# Patient Record
Sex: Male | Born: 1950 | Race: White | Hispanic: No | Marital: Married | Smoking: Never smoker
Health system: Southern US, Community
[De-identification: ages and names within clinical notes are randomized; demographics above are authoritative.]

## PROBLEM LIST (undated history)

## (undated) DIAGNOSIS — K219 Gastro-esophageal reflux disease without esophagitis: Secondary | ICD-10-CM

## (undated) DIAGNOSIS — G20A1 Parkinson's disease without dyskinesia, without mention of fluctuations: Secondary | ICD-10-CM

## (undated) DIAGNOSIS — I2510 Atherosclerotic heart disease of native coronary artery without angina pectoris: Secondary | ICD-10-CM

## (undated) DIAGNOSIS — I10 Essential (primary) hypertension: Secondary | ICD-10-CM

## (undated) DIAGNOSIS — A6920 Lyme disease, unspecified: Secondary | ICD-10-CM

## (undated) DIAGNOSIS — M1990 Unspecified osteoarthritis, unspecified site: Secondary | ICD-10-CM

## (undated) DIAGNOSIS — E785 Hyperlipidemia, unspecified: Secondary | ICD-10-CM

## (undated) DIAGNOSIS — I639 Cerebral infarction, unspecified: Secondary | ICD-10-CM

## (undated) DIAGNOSIS — H409 Unspecified glaucoma: Secondary | ICD-10-CM

## (undated) DIAGNOSIS — G20 Parkinson's disease: Secondary | ICD-10-CM

## (undated) DIAGNOSIS — C801 Malignant (primary) neoplasm, unspecified: Secondary | ICD-10-CM

## (undated) HISTORY — PX: ORIF ORBITAL FRACTURE: SHX5312

## (undated) HISTORY — PX: OTHER SURGICAL HISTORY: SHX169

## (undated) HISTORY — PX: BICEPS TENDON REPAIR: SHX566

## (undated) HISTORY — DX: Gastro-esophageal reflux disease without esophagitis: K21.9

## (undated) HISTORY — DX: Cerebral infarction, unspecified: I63.9

## (undated) HISTORY — DX: Atherosclerotic heart disease of native coronary artery without angina pectoris: I25.10

## (undated) HISTORY — PX: CORONARY ARTERY BYPASS GRAFT: SHX141

## (undated) HISTORY — PX: SPINAL FIXATION SURGERY W/ IMPLANT: SHX785

## (undated) HISTORY — DX: Unspecified glaucoma: H40.9

## (undated) HISTORY — DX: Hyperlipidemia, unspecified: E78.5

## (undated) HISTORY — PX: ANEURYSM COILING: SHX5349

## (undated) HISTORY — DX: Lyme disease, unspecified: A69.20

---

## 2010-04-28 ENCOUNTER — Ambulatory Visit (HOSPITAL_COMMUNITY)
Admission: EM | Admit: 2010-04-28 | Discharge: 2010-04-28 | Disposition: A | Discharge status: Home / Self Care | Payer: Managed Care, Other (non HMO) | Attending: Gastroenterology | Admitting: Gastroenterology

## 2010-04-28 ENCOUNTER — Other Ambulatory Visit: Payer: Self-pay | Admitting: Gastroenterology

## 2010-04-28 DIAGNOSIS — I10 Essential (primary) hypertension: Secondary | ICD-10-CM | POA: Insufficient documentation

## 2010-04-28 DIAGNOSIS — Y838 Other surgical procedures as the cause of abnormal reaction of the patient, or of later complication, without mention of misadventure at the time of the procedure: Secondary | ICD-10-CM

## 2010-04-28 DIAGNOSIS — D129 Benign neoplasm of anus and anal canal: Secondary | ICD-10-CM

## 2010-04-28 DIAGNOSIS — K648 Other hemorrhoids: Secondary | ICD-10-CM

## 2010-04-28 DIAGNOSIS — Z9889 Other specified postprocedural states: Secondary | ICD-10-CM | POA: Insufficient documentation

## 2010-04-28 DIAGNOSIS — D128 Benign neoplasm of rectum: Secondary | ICD-10-CM | POA: Insufficient documentation

## 2010-04-28 DIAGNOSIS — IMO0002 Reserved for concepts with insufficient information to code with codable children: Secondary | ICD-10-CM

## 2010-04-28 DIAGNOSIS — K625 Hemorrhage of anus and rectum: Secondary | ICD-10-CM | POA: Insufficient documentation

## 2010-04-28 LAB — DIFFERENTIAL
Basophils Absolute: 0 10*3/uL (ref 0.0–0.1)
Basophils Relative: 0 % (ref 0–1)
Eosinophils Absolute: 0.1 10*3/uL (ref 0.0–0.7)
Eosinophils Relative: 2 % (ref 0–5)
Lymphocytes Relative: 31 % (ref 12–46)
Lymphs Abs: 2.2 10*3/uL (ref 0.7–4.0)
Monocytes Absolute: 0.4 10*3/uL (ref 0.1–1.0)
Monocytes Relative: 5 % (ref 3–12)
Neutro Abs: 4.4 10*3/uL (ref 1.7–7.7)
Neutrophils Relative %: 62 % (ref 43–77)

## 2010-04-28 LAB — CBC
HCT: 42.2 % (ref 39.0–52.0)
Hemoglobin: 14.2 g/dL (ref 13.0–17.0)
MCH: 30 pg (ref 26.0–34.0)
MCHC: 33.6 g/dL (ref 30.0–36.0)
MCV: 89.2 fL (ref 78.0–100.0)
Platelets: 268 10*3/uL (ref 150–400)
RBC: 4.73 MIL/uL (ref 4.22–5.81)
RDW: 13.7 % (ref 11.5–15.5)
WBC: 7.2 10*3/uL (ref 4.0–10.5)

## 2010-04-28 LAB — SAMPLE TO BLOOD BANK

## 2010-04-28 LAB — BASIC METABOLIC PANEL
BUN: 14 mg/dL (ref 6–23)
CO2: 26 mEq/L (ref 19–32)
Calcium: 9.1 mg/dL (ref 8.4–10.5)
Chloride: 106 mEq/L (ref 96–112)
Creatinine, Ser: 0.86 mg/dL (ref 0.4–1.5)
GFR calc Af Amer: >60 mL/min (ref >60)
GFR calc non Af Amer: >60 mL/min (ref >60)
Glucose, Bld: 112 mg/dL — ABNORMAL HIGH (ref 70–99)
Potassium: 4 mEq/L (ref 3.5–5.1)
Sodium: 138 mEq/L (ref 135–145)

## 2010-04-28 LAB — APTT: aPTT: 31 seconds (ref 24–37)

## 2010-04-28 LAB — PROTIME-INR
INR: 0.99 (ref 0.00–1.49)
Prothrombin Time: 13.3 seconds (ref 11.6–15.2)

## 2010-06-02 NOTE — Op Note (Signed)
NAME:  Brett Patterson, Brett Patterson NO.:  [id number]  MEDICAL RECORD NO.:  [id number]           PATIENT TYPE:  O  LOCATION:  DAYP                          FACILITY:  [facility]  PHYSICIAN:  Jonette Eva, M.D.     DATE OF BIRTH:  11/30/1950  DATE OF PROCEDURE:  04/28/2010 DATE OF DISCHARGE:                              OPERATIVE REPORT   PRIMARY PHYSICIAN:  Dr. Rometta Emery, [place], [place].  REFERRING PHYSICIAN:  Devoria Albe, MD  PRIMARY GASTROENTEROLOGIST:  Dr. Halina Maidens.  PROCEDURE:  Sigmoidoscopy with cold forceps polypectomy and [employer] resolution clip placement followed by epinephrine injection (4 mL).  INDICATION FOR EXAM:  [name] is a 60 year old male who presented for average-risk colon cancer screening on April 27, 2010.  He had a 3- cm polyp removed from his rectum.  Snare cautery was applied.  I spoke with Dr. Aleene Davidson and he had no hemoclips available due to a shipping error.  The patient started having rectal bleeding last night.  He drove to our emergency department for evaluation.  In the emergency department, he remained hemodynamically stable, but had bright red blood per rectum.  FINDINGS: 1. Large clot in the rectum with old clotted blood extending to the     distal sigmoid colon.  The large clot was aspirated and active     bleeding was noted at the base of the polypectomy site.  One Boston     resolution clip was placed.  Hemostasis was achieved.  4 mL of     epinephrine was injected around the base of the polypectomy site.     The second Boston resolution clip was placed distally to the first     clip.  Hemostasis was achieved. 2. Unable to appreciate diverticula in the descending or sigmoid     colon.  Otherwise, no masses, inflammatory changes, or AVMs seen. 3. 2 to 3-mm sessile rectal polyps, which were hyperplastic appearing     were removed. 4. Moderate internal hemorrhoids.  Otherwise normal retroflexed view     of  the rectum.  RECOMMENDATIONS: 1. The patient should avoid aspirin, NSAIDs, and anticoagulation for     10 days. 2. He should follow a low-residue diet for 10 days.  Then he may     follow a high-fiber diet.  He is given handout on high-fiber diet     and on low-residue diet. 3. Screening colonoscopy based on pathology results. 4. We will call him with results of his pathology.  He is also given     information on hemorrhoids.  MEDICATIONS: 1. Demerol 50 mg IV. 2. Versed 2 mg IV. 3. Phenergan 12.5 mg IV.  PROCEDURE TECHNIQUE:  Physical exam was performed.  Informed consent was obtained from the patient after explaining the benefits, risks, and alternatives to the procedure.  The patient was connected to monitor and placed in left lateral position.  Continuous oxygen was provided by nasal cannula and IV medicine administered through an indwelling cannula.  After administration of sedation, rectal exam, the patient's rectum was intubated and scope was advanced under direct visualization to the descending colon.  No old blood or fresh blood was seen in the descending colon.  Semi-formed stool was seen in the descending colon and proximal sigmoid colon.  The scope was removed slowly by carefully examining the color, texture, anatomy, and integrity of the mucosa on the way out.  The patient was recovered in endoscopy and discharged home in satisfactory condition.  PATH: HYPERPLASTIC POLYP   Jonette Eva, M.D.     SF/MEDQ  D:  04/28/2010  T:  04/28/2010  Job:  [id number]  cc:   Rometta Emery, MD [facility]  Halina Maidens, MD [facility]  Electronically Signed by Jonette Eva M.D. on 06/02/2010 02:19:44 PM

## 2010-06-02 NOTE — Consult Note (Signed)
NAME:  Brett Patterson, Brett Patterson NO.:  [id number]  MEDICAL RECORD NO.:  [id number]           PATIENT TYPE:  O  LOCATION:  DAYP                          FACILITY:  [facility]  PHYSICIAN:  Jonette Eva, M.D.     DATE OF BIRTH:  1950/05/22  DATE OF CONSULTATION:  04/28/2010 DATE OF DISCHARGE:                                CONSULTATION   REFERRING PHYSICIAN:  Dr. Lynelle Doctor.  PRIMARY GASTROENTEROLOGIST:  Halina Maidens, MD  REASON FOR CONSULTATION:  Rectal bleeding.  HISTORY OF PRESENT ILLNESS:  Brett Patterson is a 60 year old male who presented on April 27, 2010 for average risk colon cancer screening. Dr. Aleene Davidson removed to 3-cm polyp and cauterize the base.  Reportedly, he denied any clips available.  The printed report shows reads that he had a 3-cm sessile multilobulated polyp in his rectum which was removed piecemeal.  The sigmoid descending transverse ascending colon and cecum were without disease.  It was a good prep.  The patient started having rectal bleeding last night.  Reportedly, the patient was instructed to come to our emergency department because Dr. Aleene Davidson does not have privileges at the [facility].  He drove from [place] to [facility].  His last meal was yesterday.  He does not use aspirin, BC, Goody Powders, ibuprofen, Motrin or Aleve.  He does use Vicodin and Xanax as needed for back pain and sleep.  He has never had any difficulties with sedation.  He recently was on a Z-PAK.  He has some lower abdominal pressure, but no abdominal pain.  He denies any chest pain, shortness of breath, dizziness or syncope.  PAST MEDICAL HISTORY: 1. Back pain. 2. Lyme disease.  PAST SURGICAL HISTORY:  Multiple back surgeries.  ALLERGIES:  CODEINE AND LATEX.  FAMILY HISTORY:  He denies any family history, colon cancer, or colon polyps.  SOCIAL HISTORY:  He does not drink, does not smoke.  He is a retired from [employer].  He has been married 25  years.  REVIEW OF SYSTEMS:  Per the HPI, otherwise all systems are negative.  PHYSICAL EXAM:  VITAL SIGNS:  Temperature 98.4, systolic blood pressure 136-145, heart rate 81-64, respiratory rate 18 -19. GENERAL:  He is in no apparent distress, alert and oriented x4.  He is ambulating without assistance. HEENT:  Atraumatic, normocephalic.  Pupils equal and reactive to light. Mouth, no oral lesions.  Posterior pharynx without erythema or exudate. NECK:  Full range of motion.  No lymphadenopathy. LUNGS:  Clear to auscultation bilaterally. CARDIOVASCULAR:  Regular rate and rhythm.  No murmur, normal S1 and S2. ABDOMEN:  Bowel sounds are present, soft, mildly distended, nontender. No rebound or guarding. NEURO:  No focal neurologic deficits.  LABS:  Creatinine 0.86, INR 0.99, hemoglobin 14.2, platelets 268.  ASSESSMENT:  Brett Patterson is a 60 year old male with a post polypectomy bleed.  PLAN: 1. Flexible sigmoidoscopy today to attempt to control the bleeding. 2. I explained to the patient and to his wife that the bleeding cannot     be controlled.  He will need to go to the operating room and have     the  surgeons stop the bleeding. 3. I did attempt to discuss the case with Dr. Aleene Davidson prior to     evaluating the patient, but he was unavailable due to his being in     the middle of the procedure.     Jonette Eva, M.D.     SF/MEDQ  D:  04/28/2010  T:  04/28/2010  Job:  [id number]  cc:   Halina Maidens, MD  Electronically Signed by Jonette Eva M.D. on 06/02/2010 02:18:13 PM

## 2015-09-19 ENCOUNTER — Telehealth: Payer: Self-pay | Admitting: Gastroenterology

## 2015-09-19 NOTE — Telephone Encounter (Signed)
Pt and his wife are transferring from Dr Anastasio Auerbach office to [place] for their colonoscopy. I have colonoscopy and path on patient and waiting for records on his wife. They are on vacation the week of Sept 18th and would like to be scheduled during this time. Due to insurance patient may not need office visit, but a triage. Please call [phone] or [phone] (I laid the records on DS office chair)

## 2015-09-22 NOTE — Telephone Encounter (Signed)
Wife called again this morning asking about her records being transferred to [place]. I told her that I have both hers and her husband's records and the triage nurse will be in touch with both of them.  She is wanting to have procedure done Oct 9th because she will be on vacation and would like to know something today. I told her that DS said she would try to call her after lunch today.

## 2015-09-24 ENCOUNTER — Other Ambulatory Visit: Payer: Self-pay

## 2015-09-24 DIAGNOSIS — Z1211 Encounter for screening for malignant neoplasm of colon: Secondary | ICD-10-CM

## 2015-09-24 NOTE — Telephone Encounter (Signed)
SUPREP SPLIT DOSING- FULL LIQUIDS WITH BREAKFAST.  Full Liquid Diet A high-calorie, high-protein supplement should be used to meet your nutritional requirements when the full liquid diet is continued for more than 2 or 3 days. If this diet is to be used for an extended period of time (more than 7 days), a multivitamin should be considered.  Breads and Starches  Allowed: None are allowed   Avoid: Any others.    Potatoes/Pasta/Rice  Allowed: ANY ITEM AS A SOUP OR SMALL PLATE OF MASHED POTATOES OR SCRAMBLED EGGS.       Vegetables  Allowed: Strained tomato or vegetable juice. Vegetables pureed in soup.   Avoid: Any others.    Fruit  Allowed: Any strained fruit juices and fruit drinks. Include 1 serving of citrus or vitamin C-enriched fruit juice daily.   Avoid: Any others.  Meat and Meat Substitutes  Allowed: Egg  Avoid: Any meat, fish, or fowl. All cheese.  Milk  Allowed: SOY Milk beverages, including milk shakes and instant breakfast mixes. Smooth yogurt.   Avoid: Any others. Avoid dairy products if not tolerated.    Soups and Combination Foods  Allowed: Broth, strained cream soups. Strained, broth-based soups.   Avoid: Any others.    Desserts and Sweets  Allowed: flavored gelatin, tapioca, ice cream, sherbet, smooth pudding, junket, fruit ices, frozen ice pops, pudding pops, frozen fudge pops, chocolate syrup. Sugar, honey, jelly, syrup.   Avoid: Any others.  Fats and Oils  Allowed: Margarine, butter, cream, sour cream, oils.   Avoid: Any others.  Beverages  Allowed: All.   Avoid: None.  Condiments  Allowed: Iodized salt, pepper, spices, flavorings. Cocoa powder.   Avoid: Any others.    SAMPLE MEAL PLAN Breakfast   cup orange juice.   1 OR 2 EGGS  1 cup milk.   1 cup beverage (coffee or tea).   Cream or sugar, if desired.    Midmorning Snack  2 SCRAMBLED OR HARD BOILED EGG   Lunch  1 cup cream soup.    cup fruit juice.    1 cup milk.    cup custard.   1 cup beverage (coffee or tea).   Cream or sugar, if desired.    Midafternoon Snack  1 cup milk shake.  Dinner  1 cup cream soup.    cup fruit juice.   1 cup MILK    cup pudding.   1 cup beverage (coffee or tea).   Cream or sugar, if desired.  Evening Snack  1 cup supplement.  To increase calories, add sugar, cream, butter, or margarine if possible. Nutritional supplements will also increase the total calories.

## 2015-09-24 NOTE — Telephone Encounter (Signed)
Gastroenterology Pre-Procedure Review  Request Date: 09/19/2015 Requesting Physician:   PATIENT REVIEW QUESTIONS: The patient responded to the following health history questions as indicated:    1. Diabetes Melitis: no 2. Joint replacements in the past 12 months: no 3. Major health problems in the past 3 months: no 4. Has an artificial valve or MVP: no 5. Has a defibrillator: no 6. Has been advised in past to take antibiotics in advance of a procedure like teeth cleaning: no 7. Family history of colon cancer: no  8. Alcohol Use: no 9. History of sleep apnea: no     MEDICATIONS & ALLERGIES:    Patient reports the following regarding taking any blood thinners:   Plavix? no Aspirin? no Coumadin? no  Patient confirms/reports the following medications:  Current Outpatient Prescriptions  Medication Sig Dispense Refill  . b complex vitamins tablet Take 1 tablet by mouth daily.    Marland Kitchen HYDROcodone-acetaminophen (NORCO/VICODIN) 5-325 MG tablet Take 1 tablet by mouth.    . Multiple Vitamin (MULTIVITAMIN) tablet Take 1 tablet by mouth daily.    . NON FORMULARY Vitamin D    One tablet daily    . rOPINIRole (REQUIP) 2 MG tablet Take 2 mg by mouth at bedtime. Takes one tablet 2-4 times a day     No current facility-administered medications for this visit.     Patient confirms/reports the following allergies:  No Known Allergies  No orders of the defined types were placed in this encounter.   AUTHORIZATION INFORMATION Primary Insurance:   ID #:  Group #:  Pre-Cert / Auth required: Pre-Cert / Auth #:   Secondary Insurance:   ID #:  Group #:  Pre-Cert / Auth required Pre-Cert / Auth #:   SCHEDULE INFORMATION: Procedure has been scheduled as follows:  Date: 10/13/2015             Time:  10:30 AM Location: [facility]  This Gastroenterology Pre-Precedure Review Form is being routed to the following provider(s): Barney Drain, MD

## 2015-10-01 MED ORDER — NA SULFATE-K SULFATE-MG SULF 17.5-3.13-1.6 GM/177ML PO SOLN: 1.0000 | ORAL | 0 refills | Status: DC

## 2015-10-01 NOTE — Addendum Note (Signed)
Addended by: Everardo All on: 10/01/2015 09:53 AM   Modules accepted: Orders

## 2015-10-01 NOTE — Telephone Encounter (Signed)
Rx sent to the pharmacy and instructions mailed to pt.  

## 2015-10-08 ENCOUNTER — Telehealth: Payer: Self-pay

## 2015-10-08 NOTE — Telephone Encounter (Signed)
I called [employer] @ [phone] and spoke to Aristes R who said PA is required for the screening colonoscopy.  Pending PA # [id number].

## 2015-10-13 ENCOUNTER — Encounter (HOSPITAL_COMMUNITY): Payer: Self-pay

## 2015-10-13 ENCOUNTER — Encounter (HOSPITAL_COMMUNITY)
Admission: RE | Disposition: A | Discharge status: Home / Self Care | Payer: Self-pay | Source: Ambulatory Visit | Attending: Gastroenterology

## 2015-10-13 ENCOUNTER — Ambulatory Visit (HOSPITAL_COMMUNITY)
Admission: RE | Admit: 2015-10-13 | Discharge: 2015-10-13 | Disposition: A | Discharge status: Home / Self Care | Payer: 59 | Source: Ambulatory Visit | Attending: Gastroenterology | Admitting: Gastroenterology

## 2015-10-13 DIAGNOSIS — Z1211 Encounter for screening for malignant neoplasm of colon: Secondary | ICD-10-CM | POA: Diagnosis not present

## 2015-10-13 DIAGNOSIS — D123 Benign neoplasm of transverse colon: Secondary | ICD-10-CM | POA: Diagnosis not present

## 2015-10-13 DIAGNOSIS — K648 Other hemorrhoids: Secondary | ICD-10-CM | POA: Diagnosis not present

## 2015-10-13 DIAGNOSIS — I10 Essential (primary) hypertension: Secondary | ICD-10-CM | POA: Insufficient documentation

## 2015-10-13 DIAGNOSIS — Z86010 Personal history of colonic polyps: Secondary | ICD-10-CM

## 2015-10-13 DIAGNOSIS — K621 Rectal polyp: Secondary | ICD-10-CM | POA: Diagnosis not present

## 2015-10-13 DIAGNOSIS — E785 Hyperlipidemia, unspecified: Secondary | ICD-10-CM | POA: Diagnosis not present

## 2015-10-13 DIAGNOSIS — D128 Benign neoplasm of rectum: Secondary | ICD-10-CM

## 2015-10-13 HISTORY — PX: COLONOSCOPY: SHX5424

## 2015-10-13 HISTORY — DX: Essential (primary) hypertension: I10

## 2015-10-13 SURGERY — COLONOSCOPY
Anesthesia: Moderate Sedation

## 2015-10-13 MED ORDER — SODIUM CHLORIDE 0.9 % IV SOLN: INTRAVENOUS | Status: DC

## 2015-10-13 MED ORDER — MIDAZOLAM HCL 5 MG/5ML IJ SOLN
INTRAMUSCULAR | Status: AC
  Filled 2015-10-13: qty 10

## 2015-10-13 MED ORDER — STERILE WATER FOR IRRIGATION IR SOLN
Status: DC | PRN
  Administered 2015-10-13: 2.5 mL

## 2015-10-13 MED ORDER — MIDAZOLAM HCL 5 MG/5ML IJ SOLN
INTRAMUSCULAR | Status: DC | PRN
Start: 2015-10-13 — End: 2015-10-13
  Administered 2015-10-13: 1 mg via INTRAVENOUS
  Administered 2015-10-13 (×2): 2 mg via INTRAVENOUS

## 2015-10-13 MED ORDER — MEPERIDINE HCL 100 MG/ML IJ SOLN
INTRAMUSCULAR | Status: DC | PRN
  Administered 2015-10-13: 25 mg via INTRAVENOUS
  Administered 2015-10-13: 50 mg via INTRAVENOUS

## 2015-10-13 MED ORDER — MEPERIDINE HCL 100 MG/ML IJ SOLN
INTRAMUSCULAR | Status: AC
  Filled 2015-10-13: qty 2

## 2015-10-13 NOTE — Op Note (Addendum)
[facility] Patient Name: Brett Patterson Procedure Date: 10/13/2015 10:05 AM MRN: [id number] Date of Birth: 1950/03/30 Attending MD: Barney Drain , MD CSN: [id number] Age: 65 Admit Type: Outpatient Procedure:                Colonoscopy WITH COLD SNARE/SNARE CAUTERY                            POLYPECTOMY Indications:              High risk colon cancer surveillance: Personal                            history of colonic polyps Providers:                Barney Drain, MD, Lurline Del, RN, Purcell Nails. [place],                            [employer] officer Referring MD:              Medicines:                Meperidine 75 mg IV, Midazolam 5 mg IV Complications:            No immediate complications. Estimated Blood Loss:     Estimated blood loss was minimal. Procedure:                Pre-Anesthesia Assessment:                           - Prior to the procedure, a History and Physical                            was performed, and patient medications and                            allergies were reviewed. The patient's tolerance of                            previous anesthesia was also reviewed. The risks                            and benefits of the procedure and the sedation                            options and risks were discussed with the patient.                            All questions were answered, and informed consent                            was obtained. Prior Anticoagulants: The patient has                            taken no previous anticoagulant or antiplatelet  agents. ASA Grade Assessment: I - A normal, healthy                            patient. After reviewing the risks and benefits,                            the patient was deemed in satisfactory condition to                            undergo the procedure. After obtaining informed                            consent, the colonoscope was passed under direct                            vision.  Throughout the procedure, the patient's                            blood pressure, pulse, and oxygen saturations were                            monitored continuously. The [id number])                            scope was introduced through the anus and advanced                            to the the cecum, identified by appendiceal orifice                            and ileocecal valve. The patient tolerated the                            procedure well. The quality of the bowel                            preparation was excellent. The colonoscopy was                            performed without difficulty. The ileocecal valve,                            appendiceal orifice, and rectum were photographed. Scope In: 10:19:20 AM Scope Out: 10:34:46 AM Scope Withdrawal Time: 0 hours 13 minutes 9 seconds  Total Procedure Duration: 0 hours 15 minutes 26 seconds  Findings:      Two sessile polyps were found in the proximal transverse colon and       distal transverse colon. The polyps were 4 to 6 mm in size. These polyps       were removed with a hot snare. Resection and retrieval were complete.      Two sessile polyps were found in the rectum. The polyps were 4 to 12 mm       in size. These polyps were removed with a hot snare  AND COLD SNARE.       Resection and retrieval were complete. one bs clip laced inrectum to       prevent bleeding.      Non-bleeding internal hemorrhoids were found. The hemorrhoids were       moderate. Impression:               - Two 4 to 6 mm polyps in the proximal transverse                            colon and in the distal transverse colon, removed                            with a hot snare. Resected and retrieved.                           - Two 4(CS) to 12 mm(HS) polyps in the rectum,                            removed with a hot snare. Resected and retrieved.                           - Non-bleeding internal hemorrhoids. Moderate Sedation:       Moderate (conscious) sedation was administered by the endoscopy nurse       and supervised by the endoscopist. The following parameters were       monitored: oxygen saturation, heart rate, blood pressure, and response       to care. Total physician intraservice time was 26 minutes. Recommendation:           - High fiber diet.                           - Continue present medications.                           - Await pathology results. NO MRI FOR 30 DAYS.                           - Repeat colonoscopy 1-3 YEARS for surveillance.                            ALL FIRST DEGREE RELATIVES NEEDS A TCS AT AGE 55.                           - Patient has a contact number available for                            emergencies. The signs and symptoms of potential                            delayed complications were discussed with the                            patient. Return to normal activities tomorrow.  Written discharge instructions were provided to the                            patient. Procedure Code(s):        --- Professional ---                           [phone], Colonoscopy, flexible; with removal of                            tumor(s), polyp(s), or other lesion(s) by snare                            technique                           99152, Moderate sedation services provided by the                            same physician or other qualified health care                            professional performing the diagnostic or                            therapeutic service that the sedation supports,                            requiring the presence of an independent trained                            observer to assist in the monitoring of the                            patient's level of consciousness and physiological                            status; initial 15 minutes of intraservice time,                            patient age 91 years or older                            [phone], Moderate sedation services; each additional                            15 minutes intraservice time Diagnosis Code(s):        --- Professional ---                           Z86.010, Personal history of colonic polyps                           D12.3, Benign neoplasm of transverse colon (hepatic  flexure or splenic flexure)                           K62.1, Rectal polyp                           K64.8, Other hemorrhoids CPT copyright 2016 American Medical Association. All rights reserved. The codes documented in this report are preliminary and upon coder review may  be revised to meet current compliance requirements. Barney Drain, MD Barney Drain, MD 10/13/2015 10:49:47 AM This report has been signed electronically. Number of Addenda: 0

## 2015-10-13 NOTE — Discharge Instructions (Signed)
You had 4 polyps removed. One was GREATER THAN 1 CM.  IT APPEARED TO BE IN THE SAME PLACE AS THE PREVIOUS POLYP.  I PLACED A CLIP TO PREVENT BLEEDING IN 7-10 DAYS. You have Small internal hemorrhoids.    NO MRI FOR 30 DAYS DUE TO METAL CLIP PLACEMENT IN THE RECTUM.  CONTINUE YOUR WEIGHT LOSS EFFORTS. LOSE TEN POUNDS.  DRINK WATER TO KEEP YOUR URINE LIGHT YELLOW.  FOLLOW A HIGH FIBER DIET. AVOID ITEMS THAT CAUSE BLOATING & GAS. SEE INFO BELOW.  YOUR BIOPSY RESULTS WILL BE AVAILABLE IN MY CHART AFTER  OCT 11 AND MY OFFICE WILL CONTACT YOU IN 10-14 DAYS WITH YOUR RESULTS.   Next colonoscopy in 1-3 years. YOUR SISTERS, BROTHERS, CHILDREN, AND PARENTS NEED TO HAVE A COLONOSCOPY STARTING AT THE AGE OF 40.    Colonoscopy Care After Read the instructions outlined below and refer to this sheet in the next week. These discharge instructions provide you with general information on caring for yourself after you leave the hospital. While your treatment has been planned according to the most current medical practices available, unavoidable complications occasionally occur. If you have any problems or questions after discharge, call DR. Linde Wilensky, [phone].  ACTIVITY  You may resume your regular activity, but move at a slower pace for the next 24 hours.   Take frequent rest periods for the next 24 hours.   Walking will help get rid of the air and reduce the bloated feeling in your belly (abdomen).   No driving for 24 hours (because of the medicine (anesthesia) used during the test).   You may shower.   Do not sign any important legal documents or operate any machinery for 24 hours (because of the anesthesia used during the test).    NUTRITION  Drink plenty of fluids.   You may resume your normal diet as instructed by your doctor.   Begin with a light meal and progress to your normal diet. Heavy or fried foods are harder to digest and may make you feel sick to your stomach (nauseated).    Avoid alcoholic beverages for 24 hours or as instructed.    MEDICATIONS  You may resume your normal medications.   WHAT YOU CAN EXPECT TODAY  Some feelings of bloating in the abdomen.   Passage of more gas than usual.   Spotting of blood in your stool or on the toilet paper  .  IF YOU HAD POLYPS REMOVED DURING THE COLONOSCOPY:  Eat a soft diet IF YOU HAVE NAUSEA, BLOATING, ABDOMINAL PAIN, OR VOMITING.    FINDING OUT THE RESULTS OF YOUR TEST Not all test results are available during your visit. DR. Oneida Alar WILL CALL YOU WITHIN 14 DAYS OF YOUR PROCEDUE WITH YOUR RESULTS. Do not assume everything is normal if you have not heard from DR. Idell Hissong, CALL HER OFFICE AT [phone].  SEEK IMMEDIATE MEDICAL ATTENTION AND CALL THE OFFICE: [phone] IF:  You have more than a spotting of blood in your stool.   Your belly is swollen (abdominal distention).   You are nauseated or vomiting.   You have a temperature over 101F.   You have abdominal pain or discomfort that is severe or gets worse throughout the day.   High-Fiber Diet A high-fiber diet changes your normal diet to include more whole grains, legumes, fruits, and vegetables. Changes in the diet involve replacing refined carbohydrates with unrefined foods. The calorie level of the diet is essentially unchanged. The Dietary Reference Intake (recommended  amount) for adult males is 38 grams per day. For adult females, it is 25 grams per day. Pregnant and lactating women should consume 28 grams of fiber per day. Fiber is the intact part of a plant that is not broken down during digestion. Functional fiber is fiber that has been isolated from the plant to provide a beneficial effect in the body. PURPOSE  Increase stool bulk.   Ease and regulate bowel movements.   Lower cholesterol.   REDUCE RISK OF COLON CANCER  INDICATIONS THAT YOU NEED MORE FIBER  Constipation and hemorrhoids.   Uncomplicated diverticulosis  (intestine condition) and irritable bowel syndrome.   Weight management.   As a protective measure against hardening of the arteries (atherosclerosis), diabetes, and cancer.   GUIDELINES FOR INCREASING FIBER IN THE DIET  Start adding fiber to the diet slowly. A gradual increase of about 5 more grams (2 slices of whole-wheat bread, 2 servings of most fruits or vegetables, or 1 bowl of high-fiber cereal) per day is best. Too rapid an increase in fiber may result in constipation, flatulence, and bloating.   Drink enough water and fluids to keep your urine clear or pale yellow. Water, juice, or caffeine-free drinks are recommended. Not drinking enough fluid may cause constipation.   Eat a variety of high-fiber foods rather than one type of fiber.   Try to increase your intake of fiber through using high-fiber foods rather than fiber pills or supplements that contain small amounts of fiber.   The goal is to change the types of food eaten. Do not supplement your present diet with high-fiber foods, but replace foods in your present diet.   INCLUDE A VARIETY OF FIBER SOURCES  Replace refined and processed grains with whole grains, canned fruits with fresh fruits, and incorporate other fiber sources. White rice, white breads, and most bakery goods contain little or no fiber.   Brown whole-grain rice, buckwheat oats, and many fruits and vegetables are all good sources of fiber. These include: broccoli, Brussels sprouts, cabbage, cauliflower, beets, sweet potatoes, white potatoes (skin on), carrots, tomatoes, eggplant, squash, berries, fresh fruits, and dried fruits.   Cereals appear to be the richest source of fiber. Cereal fiber is found in whole grains and bran. Bran is the fiber-rich outer coat of cereal grain, which is largely removed in refining. In whole-grain cereals, the bran remains. In breakfast cereals, the largest amount of fiber is found in those with "bran" in their names. The fiber  content is sometimes indicated on the label.   You may need to include additional fruits and vegetables each day.   In baking, for 1 cup white flour, you may use the following substitutions:   1 cup whole-wheat flour minus 2 tablespoons.   1/2 cup white flour plus 1/2 cup whole-wheat flour.   Polyps, Colon  A polyp is extra tissue that grows inside your body. Colon polyps grow in the large intestine. The large intestine, also called the colon, is part of your digestive system. It is a long, hollow tube at the end of your digestive tract where your body makes and stores stool. Most polyps are not dangerous. They are benign. This means they are not cancerous. But over time, some types of polyps can turn into cancer. Polyps that are smaller than a pea are usually not harmful. But larger polyps could someday become or may already be cancerous. To be safe, doctors remove all polyps and test them.   WHO GETS POLYPS? Anyone  can get polyps, but certain people are more likely than others. You may have a greater chance of getting polyps if:  You are over 50.   You have had polyps before.   Someone in your family has had polyps.   Someone in your family has had cancer of the large intestine.   Find out if someone in your family has had polyps. You may also be more likely to get polyps if you:   Eat a lot of fatty foods   Smoke   Drink alcohol   Do not exercise  Eat too much   PREVENTION There is not one sure way to prevent polyps. You might be able to lower your risk of getting them if you:  Eat more fruits and vegetables and less fatty food.   Do not smoke.   Avoid alcohol.   Exercise every day.   Lose weight if you are overweight.   Eating more calcium and folate can also lower your risk of getting polyps. Some foods that are rich in calcium are milk, cheese, and broccoli. Some foods that are rich in folate are chickpeas, kidney beans, and spinach.   Hemorrhoids Hemorrhoids  are dilated (enlarged) veins around the rectum. Sometimes clots will form in the veins. This makes them swollen and painful. These are called thrombosed hemorrhoids. Causes of hemorrhoids include:  Constipation.   Straining to have a bowel movement.   HEAVY LIFTING  HOME CARE INSTRUCTIONS  Eat a well balanced diet and drink 6 to 8 glasses of water every day to avoid constipation. You may also use a bulk laxative.   Avoid straining to have bowel movements.   Keep anal area dry and clean.   Do not use a donut shaped pillow or sit on the toilet for long periods. This increases blood pooling and pain.   Move your bowels when your body has the urge; this will require less straining and will decrease pain and pressure.

## 2015-10-13 NOTE — H&P (Signed)
  Primary Care Physician:  No PCP Per Patient Primary Gastroenterologist:  Dr. Oneida Alar  Pre-Procedure History & Physical: HPI:  Brett Patterson is a 65 y.o. male here for COLON CANCER SCREENING.  Past Medical History:  Diagnosis Date  . Hypertension     Past Surgical History:  Procedure Laterality Date  . BICEPS TENDON REPAIR Left year ago  . hyperlipidemia    . SPINAL FIXATION SURGERY W/ IMPLANT     times 5  . tendon transplant Right 7month ago    Prior to Admission medications   Medication Sig Start Date End Date Taking? Authorizing Provider  b complex vitamins tablet Take 1 tablet by mouth daily.   Yes Historical Provider, MD  cholecalciferol (VITAMIN D) 1000 units tablet Take 1,000 Units by mouth daily.   Yes Historical Provider, MD  HYDROcodone-acetaminophen (NORCO/VICODIN) 5-325 MG tablet Take 1 tablet by mouth every 4 (four) hours as needed for moderate pain.    Yes Historical Provider, MD  Multiple Vitamin (MULTIVITAMIN) tablet Take 1 tablet by mouth daily.   Yes Historical Provider, MD  Na Sulfate-K Sulfate-Mg Sulf (SUPREP BOWEL PREP KIT) 17.5-3.13-1.6 GM/180ML SOLN Take 1 kit by mouth as directed. 10/01/15  Yes SDanie Binder MD  rOPINIRole (REQUIP) 2 MG tablet Take 2 mg by mouth at bedtime. Takes one tablet 2-4 times a day   Yes Historical Provider, MD    Allergies as of 09/24/2015  . (No Known Allergies)    History reviewed. No pertinent family history.  Social History   Social History  . Marital status: Married    Spouse name: N/A  . Number of children: N/A  . Years of education: N/A   Occupational History  . Not on file.   Social History Main Topics  . Smoking status: Never Smoker  . Smokeless tobacco: Never Used  . Alcohol use No  . Drug use: No  . Sexual activity: Not on file   Other Topics Concern  . Not on file   Social History Narrative  . No narrative on file    Review of Systems: See HPI, otherwise negative ROS   Physical  Exam: There were no vitals taken for this visit. General:   Alert,  pleasant and cooperative in NAD Head:  Normocephalic and atraumatic. Neck:  Supple; Lungs:  Clear throughout to auscultation.    Heart:  Regular rate and rhythm. Abdomen:  Soft, nontender and nondistended. Normal bowel sounds, without guarding, and without rebound.   Neurologic:  Alert and  oriented x4;  grossly normal neurologically.  Impression/Plan:     SCREENING  Plan:  1. TCS TODAY. DISCUSSED PROCEDURE, BENEFITS, & RISKS: < 1% chance of medication reaction, bleeding, perforation, or rupture of spleen/liver.

## 2015-10-14 ENCOUNTER — Telehealth: Payer: Self-pay | Admitting: Gastroenterology

## 2015-10-14 NOTE — Telephone Encounter (Signed)
LMOM to call.

## 2015-10-14 NOTE — Telephone Encounter (Signed)
Reminder in epic °

## 2015-10-14 NOTE — Telephone Encounter (Signed)
Please call pt. 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 THE OTHER WAS A HYPERPLASTIC POLYP. He needs a TCS in 3 years. HE SHOULD FOLLOW A HIGH FIBER DIET. YOUR SISTERS, BROTHERS, CHILDREN, AND PARENTS NEED TO HAVE A COLONOSCOPY STARTING AT THE AGE OF 40.

## 2015-10-14 NOTE — Telephone Encounter (Signed)
PT is aware.

## 2015-10-17 ENCOUNTER — Encounter (HOSPITAL_COMMUNITY): Payer: Self-pay | Admitting: Gastroenterology

## 2015-10-31 ENCOUNTER — Other Ambulatory Visit: Payer: Self-pay | Admitting: Neurosurgery

## 2015-10-31 DIAGNOSIS — M4120 Other idiopathic scoliosis, site unspecified: Secondary | ICD-10-CM

## 2015-11-20 ENCOUNTER — Other Ambulatory Visit: Payer: Self-pay | Admitting: Neurosurgery

## 2015-11-20 DIAGNOSIS — M4120 Other idiopathic scoliosis, site unspecified: Secondary | ICD-10-CM

## 2016-11-03 ENCOUNTER — Encounter: Payer: Self-pay | Admitting: Neurology

## 2016-11-23 NOTE — Progress Notes (Deleted)
Brett Patterson was seen today in the movement disorders clinic for neurologic consultation at the request of Erline Levine, MD.  The consultation is for the evaluation of tremor and possible PD.  The records that were made available to me were reviewed.  Pt has chronic LBP, who has been seeing Dr. Vertell Limber.  Dr. Vertell Limber noted tremor and he was subsequently referred here.    The first symptom(s) the patient noticed was {parkinsons general sx:18033} and this was {NUMBERS;0-15 BY 1:408015} {days/wks/mos/yrs:310907}.    Specific Symptoms:  Tremor: {yes no:314532} Family hx of similar:  {yes no:314532} Voice: *** Sleep: ***  Vivid Dreams:  {yes no:314532}  Acting out dreams:  {yes no:314532} Wet Pillows: {yes no:314532} Postural symptoms:  {yes no:314532}  Falls?  {yes no:314532} Bradykinesia symptoms: {parkinson brady:18041} Loss of smell:  {yes no:314532} Loss of taste:  {yes no:314532} Urinary Incontinence:  {yes no:314532} Difficulty Swallowing:  {yes no:314532} Handwriting, micrographia: {yes no:314532} Trouble with ADL's:  {yes no:314532}  Trouble buttoning clothing: {yes no:314532} Depression:  {yes no:314532} Memory changes:  {yes no:314532} Hallucinations:  {yes no:314532}  visual distortions: {yes no:314532} N/V:  {yes no:314532} Lightheaded:  {yes no:314532}  Syncope: {yes no:314532} Diplopia:  {yes no:314532} Dyskinesia:  {yes no:314532}  Neuroimaging of the brain has *** previously been performed.  It *** available for my review today.  PREVIOUS MEDICATIONS: {Parkinson's RX:18200}  ALLERGIES:  No Known Allergies  CURRENT MEDICATIONS:  Outpatient Encounter Medications as of 11/30/2016  Medication Sig  . b complex vitamins tablet Take 1 tablet by mouth daily.  . cholecalciferol (VITAMIN D) 1000 units tablet Take 1,000 Units by mouth daily.  Marland Kitchen HYDROcodone-acetaminophen (NORCO/VICODIN) 5-325 MG tablet Take 1 tablet by mouth every 4 (four) hours as needed for  moderate pain.   . Multiple Vitamin (MULTIVITAMIN) tablet Take 1 tablet by mouth daily.  Marland Kitchen rOPINIRole (REQUIP) 2 MG tablet Take 2 mg by mouth at bedtime. Takes one tablet 2-4 times a day   No facility-administered encounter medications on file as of 11/30/2016.     PAST MEDICAL HISTORY:   Past Medical History:  Diagnosis Date  . Hypertension     PAST SURGICAL HISTORY:   Past Surgical History:  Procedure Laterality Date  . BICEPS TENDON REPAIR Left year ago  . COLONOSCOPY N/A 10/13/2015   Procedure: COLONOSCOPY;  Surgeon: Danie Binder, MD;  Location: [facility];  Service: Endoscopy;  Laterality: N/A;  10:30 Am  . hyperlipidemia    . SPINAL FIXATION SURGERY W/ IMPLANT     times 5  . tendon transplant Right 85months ago    SOCIAL HISTORY:   Social History   Socioeconomic History  . Marital status: Married    Spouse name: Not on file  . Number of children: Not on file  . Years of education: Not on file  . Highest education level: Not on file  Social Needs  . Financial resource strain: Not on file  . Food insecurity - worry: Not on file  . Food insecurity - inability: Not on file  . Transportation needs - medical: Not on file  . Transportation needs - non-medical: Not on file  Occupational History  . Not on file  Tobacco Use  . Smoking status: Never Smoker  . Smokeless tobacco: Never Used  Substance and Sexual Activity  . Alcohol use: No  . Drug use: No  . Sexual activity: Not on file  Other Topics Concern  . Not on file  Social History  Narrative  . Not on file    FAMILY HISTORY:   No family status information on file.    ROS:  A complete 10 system review of systems was obtained and was unremarkable apart from what is mentioned above.  PHYSICAL EXAMINATION:    VITALS:  There were no vitals filed for this visit.  GEN:  The patient appears stated age and is in NAD. HEENT:  Normocephalic, atraumatic.  The mucous membranes are moist. The superficial  temporal arteries are without ropiness or tenderness. CV:  RRR Lungs:  CTAB Neck/HEME:  There are no carotid bruits bilaterally.  Neurological examination:  Orientation: The patient is alert and oriented x3. Fund of knowledge is appropriate.  Recent and remote memory are intact.  Attention and concentration are normal.    Able to name objects and repeat phrases. Cranial nerves: There is good facial symmetry. Pupils are equal round and reactive to light bilaterally. Fundoscopic exam reveals clear margins bilaterally. Extraocular muscles are intact. The visual fields are full to confrontational testing. The speech is fluent and clear. Soft palate rises symmetrically and there is no tongue deviation. Hearing is intact to conversational tone. Sensation: Sensation is intact to light and pinprick throughout (facial, trunk, extremities). Vibration is intact at the bilateral big toe. There is no extinction with double simultaneous stimulation. There is no sensory dermatomal level identified. Motor: Strength is 5/5 in the bilateral upper and lower extremities.   Shoulder shrug is equal and symmetric.  There is no pronator drift. Deep tendon reflexes: Deep tendon reflexes are 2/4 at the bilateral biceps, triceps, brachioradialis, patella and achilles. Plantar responses are downgoing bilaterally.  Movement examination: Tone: There is ***tone in the bilateral upper extremities.  The tone in the lower extremities is ***.  Abnormal movements: *** Coordination:  There is *** decremation with RAM's, *** Gait and Station: The patient has *** difficulty arising out of a deep-seated chair without the use of the hands. The patient's stride length is ***.  The patient has a *** pull test.      ASSESSMENT/PLAN:  ***  Cc:  Patient, No Pcp Per

## 2016-11-30 ENCOUNTER — Ambulatory Visit: Payer: 59 | Admitting: Neurology

## 2017-03-11 ENCOUNTER — Encounter: Payer: Self-pay | Admitting: Neurology

## 2017-03-25 NOTE — Progress Notes (Signed)
Brett Patterson was seen today in the movement disorders clinic for neurologic consultation at the request of Erline Levine, MD.  The consultation is for the evaluation of tremor.  This patient is accompanied in the office by his spouse who supplements the history.The records that were made available to me were reviewed.  Pt has previously been evaluated by neurology in [place].  He brings some of his records.  However, EMG was dated April, 2014.  This suggested a generalized peripheral neuropathy.  There was also part of a note indicating restless leg from March, 2015 and he was on Requip at that time, 1 mg 3 times per day.  He is currently on requip 2 mg qid to 5 times a day (last in the middle of the night).  Does state that he was seen at [facility] and told that he may have Parkinson's disease due to what sounds like mask facies.  I searched care everywhere and it appears that the last time he was seen by neurology at [facility] was 2006 and that was for low back pain.  He has seen neurosurgery   Specific Symptoms:  Tremor: Yes.  , 1 year.  L hand but some in the R thumb now.  Notices it when walking.  Trouble with keeping food on the fork.  He was put on requip 3 years ago for RLS and has increased dosage with time.  It "possibly" helps.  He has trouble describing the feeling of RLS but does state that he has to get up and move the legs and shake the hands and he will need to get up and "pop an extra requip."  State that after CABG, "neuropathy" was better for 3 weeks but it came back. Family hx of similar:  No. Voice: no change Sleep:   Vivid Dreams:  Yes.  , bad dreams  Acting out dreams:  Yes.  , some yelling Wet Pillows: No. but has nighttime drooling Postural symptoms:  No. (some from chronic LBP)  Falls?  No. but sometimes feels like someone is behind him and pulling him backward Bradykinesia symptoms: slow movements and difficulty getting out of a chair Loss of smell:  No. Loss of taste:   "food doesn't taste like it used to" Urinary Incontinence:  No. Difficulty Swallowing:  No. Handwriting, micrographia: "maybe a little smaller" Trouble with ADL's:  No.  Trouble buttoning clothing: No. - some trouble with dexterity Depression:  No., but admits to some frustration with medical issues Memory changes:  Yes.  , minor short term memory change Hallucinations:  No.  visual distortions: No. N/V:  No. Lightheaded:  Yes.   - not just when first stands up  Syncope: No. Diplopia:  No. Dyskinesia:  No.  Neuroimaging of the brain has not previously been performed.   ALLERGIES:   Allergies  Allergen Reactions  . Codeine Other (See Comments), Anxiety and Itching    Other Reaction: Other reaction     CURRENT MEDICATIONS:  Outpatient Encounter Medications as of 03/29/2017  Medication Sig  . Alpha-Lipoic Acid 100 MG CAPS Take by mouth.  Marland Kitchen atorvastatin (LIPITOR) 40 MG tablet Take by mouth.  Marland Kitchen b complex vitamins tablet Take 1 tablet by mouth daily.  . cholecalciferol (VITAMIN D) 1000 units tablet Take 1,000 Units by mouth daily.  . Coenzyme Q10 (COQ10) 100 MG CAPS Take by mouth.  . fluticasone (FLONASE) 50 MCG/ACT nasal spray USE 1 SPRAY IN EACH NOSTRIL AT BEDTIME  . furosemide (LASIX)  20 MG tablet Take by mouth.  Marland Kitchen HYDROcodone-acetaminophen (NORCO/VICODIN) 5-325 MG tablet Take 1 tablet by mouth every 4 (four) hours as needed for moderate pain.   . metoprolol tartrate (LOPRESSOR) 25 MG tablet Take 25 mg by mouth 2 (two) times daily.  . Multiple Vitamin (MULTIVITAMIN) tablet Take 1 tablet by mouth daily.  . nitroGLYCERIN (NITROSTAT) 0.4 MG SL tablet PLACE ONE TABLET SUBLINGUALLY EVERY 5 MINTUES FOR CHEST PAIN, MAX 3 TABS  . oxycodone-acetaminophen (PERCOCET) 2.5-325 MG tablet Take by mouth.  . pantoprazole (PROTONIX) 40 MG tablet Take by mouth.  . potassium chloride (KLOR-CON) 8 MEQ tablet Take 8 mEq by mouth 2 times daily.  Reported on 02/05/2015  . rOPINIRole (REQUIP) 2 MG tablet  Take 2 mg by mouth at bedtime. Takes one tablet 2-4 times a day   No facility-administered encounter medications on file as of 03/29/2017.     PAST MEDICAL HISTORY:   Past Medical History:  Diagnosis Date  . CAD (coronary artery disease)   . GERD (gastroesophageal reflux disease)   . Glaucoma    unsure if closed/open angle glaucoma  . Hyperlipidemia   . Hypertension     PAST SURGICAL HISTORY:   Past Surgical History:  Procedure Laterality Date  . BICEPS TENDON REPAIR Left   . COLONOSCOPY N/A 10/13/2015   Procedure: COLONOSCOPY;  Surgeon: Danie Binder, MD;  Location: [facility];  Service: Endoscopy;  Laterality: N/A;  10:30 Am  . CORONARY ARTERY BYPASS GRAFT    . SPINAL FIXATION SURGERY W/ IMPLANT     times 5  . tendon transplant Right 53months ago    SOCIAL HISTORY:   Social History   Socioeconomic History  . Marital status: Married    Spouse name: Not on file  . Number of children: Not on file  . Years of education: Not on file  . Highest education level: Not on file  Occupational History  . Occupation: disability    Comment: Museum/gallery curator  . Financial resource strain: Not on file  . Food insecurity:    Worry: Not on file    Inability: Not on file  . Transportation needs:    Medical: Not on file    Non-medical: Not on file  Tobacco Use  . Smoking status: Never Smoker  . Smokeless tobacco: Never Used  Substance and Sexual Activity  . Alcohol use: Yes    Comment: rare special occasions  . Drug use: No  . Sexual activity: Not on file  Lifestyle  . Physical activity:    Days per week: Not on file    Minutes per session: Not on file  . Stress: Not on file  Relationships  . Social connections:    Talks on phone: Not on file    Gets together: Not on file    Attends religious service: Not on file    Active member of club or organization: Not on file    Attends meetings of clubs or organizations: Not on file    Relationship status: Not on file    . Intimate partner violence:    Fear of current or ex partner: Not on file    Emotionally abused: Not on file    Physically abused: Not on file    Forced sexual activity: Not on file  Other Topics Concern  . Not on file  Social History Narrative  . Not on file    FAMILY HISTORY:   Family Status  Relation Name Status  .  Mother  Alive  . Father  Deceased  . Sister  Deceased  . Brother 2 Alive  . Child 2 Alive    ROS:  Chronic LBP.  Has some LE edema.  Some CP despite CABG last year and told noncardiac.  A complete 10 system review of systems was obtained and was unremarkable apart from what is mentioned above.  PHYSICAL EXAMINATION:    VITALS:   Vitals:   03/29/17 0955  BP: 120/80  Pulse: 90  SpO2: 98%  Weight: 211 lb 1 oz (95.7 kg)  Height: 5\' 7"  (1.702 m)    GEN:  The patient appears stated age and is in NAD. HEENT:  Normocephalic, atraumatic.  The mucous membranes are moist. The superficial temporal arteries are without ropiness or tenderness. CV:  RRR Lungs:  CTAB Neck/HEME:  There are no carotid bruits bilaterally.  Neurological examination:  Orientation: The patient is alert and oriented x3. Fund of knowledge is appropriate.  Recent and remote memory are intact.  Attention and concentration are normal.    Able to name objects and repeat phrases. Cranial nerves: There is good facial symmetry.  There is facial hypomimia.  There is R ptosis (prior sx after orbital fx).  Pupils are equal round and reactive to light bilaterally. Fundoscopic exam reveals clear margins bilaterally. Extraocular muscles are intact. The visual fields are full to confrontational testing. The speech is fluent and clear. Soft palate rises symmetrically and there is no tongue deviation. Hearing is intact to conversational tone. Sensation: Sensation is intact to light and pinprick throughout (facial, trunk, extremities). Vibration is intact at the bilateral big toe. There is no extinction with  double simultaneous stimulation. There is no sensory dermatomal level identified. Motor: Strength is at least 5-/5 in upper and lower extremities.  There is some give way weakness.  Manual motor testing is limited by pain.  Grip strength is good and equal bilaterally. Deep tendon reflexes: Deep tendon reflexes are 2/4 at the bilateral biceps, triceps, brachioradialis, patella and achilles. Plantar responses are downgoing bilaterally.  Movement examination: Tone: There is normal tone in the bilateral upper extremities.  The tone in the lower extremities is normal.  Abnormal movements: There is RUE tremor, rare and doesn't increase with distraction.  There is LUE tremor with ambulation Coordination:  There is decremation with RAM's, only with heel and toe taps on the left (patient attributes this to multiple back surgeries).  All other rapid alternating movements are normal. Gait and Station: The patient has difficulty arising out of a deep-seated chair without the use of the hands, mostly because of back pain. The patient's stride length is good.      There is decreased arm swing on the L.  There is LUE re-emergent tremor.    Labs: Patient brought in lab work dated March 70, 2019.  White blood cells were 6.4, hemoglobin 15.1, hematocrit 46.6 and platelets 229.  Sodium 140, potassium 4.2, chloride 102, CO2 27, BUN 24, creatinine 0.9.  TSH was 1.15.  ASSESSMENT/PLAN:  1.  Parkinsonism  -I told him that while he does not currently meet the [place] brain bank criteria for the diagnosis of idiopathic Parkinson's disease, I also told the patient that I think that it is likely that this diagnosis is not far off. I discussed with him that it can take years for dopamine loss to occur in the brain and to ultimately come to the diagnosis, but I suspect that we will see him meeting criteria within  a fairly short time span. We discussed nature and pathophysiology. We discussed the importance of safe, cardiovascular  exercise.  -I offered the patient levodopa even though he does not meet criteria, primarily because he is considering undergoing more back surgery.  He is going to think about this but he asked several questions about risk benefits and I answered the questions to the best of my ability.  -Discussed DaT scan, but he apparently has [employer] as a primary insurance because his wife still works.  They will not allow for this test.  2.  LE edema  -not noted today but he complains about this.  Told could be due to requip.    3.  RLS  -on requip.  Likely getting some augmentation.  Discussed this phenomenon today.  4.  I will plan on seeing him back in the next 6 months (if he is not opt for medication).  Otherwise, I will see him back sooner.  Much greater than 50% of this visit was spent in counseling and coordinating care.  Total face to face time:  60 min   . Cc:  Erline Levine, MD

## 2017-03-29 ENCOUNTER — Ambulatory Visit (INDEPENDENT_AMBULATORY_CARE_PROVIDER_SITE_OTHER): Payer: BLUE CROSS/BLUE SHIELD | Admitting: Neurology

## 2017-03-29 ENCOUNTER — Encounter: Payer: Self-pay | Admitting: Neurology

## 2017-03-29 VITALS — BP 120/80 | HR 90 | Ht 67.0 in | Wt 211.1 lb

## 2017-03-29 DIAGNOSIS — R251 Tremor, unspecified: Secondary | ICD-10-CM | POA: Diagnosis not present

## 2017-03-29 DIAGNOSIS — G20C Parkinsonism, unspecified: Secondary | ICD-10-CM

## 2017-03-29 DIAGNOSIS — R600 Localized edema: Secondary | ICD-10-CM | POA: Diagnosis not present

## 2017-03-29 DIAGNOSIS — G2581 Restless legs syndrome: Secondary | ICD-10-CM

## 2017-03-29 DIAGNOSIS — G20 Parkinson's disease: Secondary | ICD-10-CM | POA: Diagnosis not present

## 2017-03-29 NOTE — Patient Instructions (Signed)
The medication is called carbidopa/levodopa.  We can reassess this in 6 months.

## 2017-09-27 ENCOUNTER — Ambulatory Visit: Payer: Medicare Other | Admitting: Neurology

## 2017-09-27 NOTE — Progress Notes (Signed)
Brett Patterson was seen today in the movement disorders clinic for neurologic consultation at the request of No ref. provider found.  The consultation is for the evaluation of tremor.  This patient is accompanied in the office by his spouse who supplements the history.The records that were made available to me were reviewed.  Pt has previously been evaluated by neurology in [place].  He brings some of his records.  However, EMG was dated April, 2014.  This suggested a generalized peripheral neuropathy.  There was also part of a note indicating restless leg from March, 2015 and he was on Requip at that time, 1 mg 3 times per day.  He is currently on requip 2 mg qid to 5 times a day (last in the middle of the night).  Does state that he was seen at [facility] and told that he may have Parkinson's disease due to what sounds like mask facies.  I searched care everywhere and it appears that the last time he was seen by neurology at [facility] was 2006 and that was for low back pain.  He has seen neurosurgery   Specific Symptoms:  Tremor: Yes.  , 1 year.  L hand but some in the R thumb now.  Notices it when walking.  Trouble with keeping food on the fork.  He was put on requip 3 years ago for RLS and has increased dosage with time.  It "possibly" helps.  He has trouble describing the feeling of RLS but does state that he has to get up and move the legs and shake the hands and he will need to get up and "pop an extra requip."  State that after CABG, "neuropathy" was better for 3 weeks but it came back. Family hx of similar:  No. Voice: no change Sleep:   Vivid Dreams:  Yes.  , bad dreams  Acting out dreams:  Yes.  , some yelling Wet Pillows: No. but has nighttime drooling Postural symptoms:  No. (some from chronic LBP)  Falls?  No. but sometimes feels like someone is behind him and pulling him backward Bradykinesia symptoms: slow movements and difficulty getting out of a chair Loss of smell:  No. Loss of  taste:  "food doesn't taste like it used to" Urinary Incontinence:  No. Difficulty Swallowing:  No. Handwriting, micrographia: "maybe a little smaller" Trouble with ADL's:  No.  Trouble buttoning clothing: No. - some trouble with dexterity Depression:  No., but admits to some frustration with medical issues Memory changes:  Yes.  , minor short term memory change Hallucinations:  No.  visual distortions: No. N/V:  No. Lightheaded:  Yes.   - not just when first stands up  Syncope: No. Diplopia:  No. Dyskinesia:  No.  Neuroimaging of the brain has not previously been performed.   09/28/17 update: Patient is seen today in follow-up for parkinsonism.  He is accompanied by his wife who supplements the history.  He did not start on any medications for Parkinson's disease, but has been on fairly high-dose Requip (at least high-dose for restless leg) for restless leg syndrome.  He takes 2 mg 4 times per day.  He is noting more tremor.  "restless leg and neuropathy is off of the wall."  Neuropathy as described as legs moving all the left time and inability to get them comfortable.  He is having insomnia but wife also states that he says things in the middle of the night that don't make sense.  He is sleepwalking (on Ambien).  He was back in the hospital 4 weeks ago for a stent and has to have another 2 placed in a few weeks.  Told that the bypass in November didn't work and the vessels blocked back up.  Has had a few falls that he describes as slips.  Is having dizziness and some SOB.    ALLERGIES:   Allergies  Allergen Reactions  . Codeine Other (See Comments), Anxiety and Itching    Other Reaction: Other reaction     CURRENT MEDICATIONS:  Outpatient Encounter Medications as of 09/28/2017  Medication Sig  . Alpha-Lipoic Acid 100 MG CAPS Take by mouth.  Marland Kitchen aspirin EC 81 MG tablet Take 81 mg by mouth daily.  Marland Kitchen atorvastatin (LIPITOR) 40 MG tablet Take by mouth.  Marland Kitchen b complex vitamins tablet Take 1  tablet by mouth daily.  . cholecalciferol (VITAMIN D) 1000 units tablet Take 1,000 Units by mouth daily.  . clopidogrel (PLAVIX) 75 MG tablet Take 75 mg by mouth daily.  . Coenzyme Q10 (COQ10) 100 MG CAPS Take by mouth.  . Multiple Vitamin (MULTIVITAMIN) tablet Take 1 tablet by mouth daily.  . potassium chloride (KLOR-CON) 8 MEQ tablet Take 8 mEq by mouth 2 times daily.  Reported on 02/05/2015  . rOPINIRole (REQUIP) 2 MG tablet Take 2 mg by mouth 4 (four) times daily.   Marland Kitchen zolpidem (AMBIEN) 5 MG tablet Take 5 mg by mouth at bedtime as needed.  . carbidopa-levodopa (SINEMET IR) 25-100 MG tablet Take 1 tablet by mouth 3 (three) times daily.  . Carbidopa-Levodopa ER (SINEMET CR) 25-100 MG tablet controlled release Take 1 tablet by mouth at bedtime.  . nitroGLYCERIN (NITROSTAT) 0.4 MG SL tablet PLACE ONE TABLET SUBLINGUALLY EVERY 5 MINTUES FOR CHEST PAIN, MAX 3 TABS  . [DISCONTINUED] fluticasone (FLONASE) 50 MCG/ACT nasal spray USE 1 SPRAY IN EACH NOSTRIL AT BEDTIME  . [DISCONTINUED] furosemide (LASIX) 20 MG tablet Take by mouth.  . [DISCONTINUED] HYDROcodone-acetaminophen (NORCO/VICODIN) 5-325 MG tablet Take 1 tablet by mouth every 4 (four) hours as needed for moderate pain.   . [DISCONTINUED] metoprolol tartrate (LOPRESSOR) 25 MG tablet Take 25 mg by mouth 2 (two) times daily.  . [DISCONTINUED] oxycodone-acetaminophen (PERCOCET) 2.5-325 MG tablet Take by mouth.  . [DISCONTINUED] pantoprazole (PROTONIX) 40 MG tablet Take by mouth.   No facility-administered encounter medications on file as of 09/28/2017.     PAST MEDICAL HISTORY:   Past Medical History:  Diagnosis Date  . CAD (coronary artery disease)   . GERD (gastroesophageal reflux disease)   . Glaucoma    unsure if closed/open angle glaucoma  . Hyperlipidemia   . Hypertension   . Lyme disease    pt reports "chronic lyme" since the 1970s    PAST SURGICAL HISTORY:   Past Surgical History:  Procedure Laterality Date  . BICEPS TENDON  REPAIR Left   . COLONOSCOPY N/A 10/13/2015   Procedure: COLONOSCOPY;  Surgeon: Danie Binder, MD;  Location: [facility];  Service: Endoscopy;  Laterality: N/A;  10:30 Am  . CORONARY ARTERY BYPASS GRAFT    . ORIF ORBITAL FRACTURE Right   . SPINAL FIXATION SURGERY W/ IMPLANT     times 5  . tendon transplant Right 34months ago    SOCIAL HISTORY:   Social History   Socioeconomic History  . Marital status: Married    Spouse name: Not on file  . Number of children: Not on file  . Years of education: Not  on file  . Highest education level: Not on file  Occupational History  . Occupation: disability    Comment: Museum/gallery curator  . Financial resource strain: Not on file  . Food insecurity:    Worry: Not on file    Inability: Not on file  . Transportation needs:    Medical: Not on file    Non-medical: Not on file  Tobacco Use  . Smoking status: Never Smoker  . Smokeless tobacco: Never Used  Substance and Sexual Activity  . Alcohol use: Yes    Comment: rare special occasions  . Drug use: No  . Sexual activity: Not on file  Lifestyle  . Physical activity:    Days per week: Not on file    Minutes per session: Not on file  . Stress: Not on file  Relationships  . Social connections:    Talks on phone: Not on file    Gets together: Not on file    Attends religious service: Not on file    Active member of club or organization: Not on file    Attends meetings of clubs or organizations: Not on file    Relationship status: Not on file  . Intimate partner violence:    Fear of current or ex partner: Not on file    Emotionally abused: Not on file    Physically abused: Not on file    Forced sexual activity: Not on file  Other Topics Concern  . Not on file  Social History Narrative  . Not on file    FAMILY HISTORY:   Family Status  Relation Name Status  . Mother  Alive  . Father  Deceased  . Sister  Deceased  . Brother 2 Alive  . Child 2 Alive    ROS:  Review  of Systems  Constitutional: Negative.   HENT: Negative.   Eyes: Negative.   Respiratory: Positive for shortness of breath.   Cardiovascular: Positive for leg swelling.  Genitourinary: Negative.   Musculoskeletal: Positive for back pain.  Skin: Negative.   Psychiatric/Behavioral: Negative.      PHYSICAL EXAMINATION:    VITALS:   Vitals:   09/28/17 0857  BP: (!) 148/84  Pulse: 80  SpO2: 98%  Weight: 205 lb (93 kg)  Height: 5' 6.5" (1.689 m)     GEN:  The patient appears stated age and is in NAD. HEENT:  Normocephalic, atraumatic.  The mucous membranes are moist. The superficial temporal arteries are without ropiness or tenderness. CV:  RRR Lungs:  CTAB Neck/HEME:  There are no carotid bruits bilaterally.  Neurological examination:  Orientation: The patient is alert and oriented x3. Cranial nerves: There is good facial symmetry except R ptosis (chronic after trauma). There is facial hypomimia.  The speech is fluent and clear. He is hypophonic.  Soft palate rises symmetrically and there is no tongue deviation. Hearing is intact to conversational tone. Sensation: Sensation is intact to light touch throughout Motor: Strength is 5/5 in the bilateral upper and lower extremities.   Shoulder shrug is equal and symmetric.  There is no pronator drift.  Movement examination: Tone: There is mild increased tone in the R>LUE Abnormal movements: There is bilateral UE resting tremor that is independent of one another. Coordination:  There is decremation with RAM's, with any form of RAMS, including alternating supination and pronation of the forearm, hand opening and closing, finger taps, heel taps and toe taps bilaterally Gait and Station: The patient has minimal difficulty  arising out of a deep-seated chair without the use of the hands. The patient has a stooped posture.  He is slow.  He has LEFT upper extremity resting tremor with ambulation   Labs: Patient brought in lab work dated  March 70, 2019.  White blood cells were 6.4, hemoglobin 15.1, hematocrit 46.6 and platelets 229.  Sodium 140, potassium 4.2, chloride 102, CO2 27, BUN 24, creatinine 0.9.  TSH was 1.15.  ASSESSMENT/PLAN:  1.  Idiopathic Parkinson's disease.  The patient has tremor, bradykinesia, rigidity and postural instability.  -We discussed the diagnosis as well as pathophysiology of the disease.  We discussed treatment options as well as prognostic indicators.  Patient education was provided.  -We discussed that it used to be thought that levodopa would increase risk of melanoma but now it is believed that Parkinsons itself likely increases risk of melanoma. he is to get regular skin checks.  -Greater than 50% of the 35 minute visit was spent in counseling answering questions and talking about what to expect now as well as in the future.  We talked about medication options as well as potential future surgical options.  We talked about safety in the home.  -We decided to add carbidopa/levodopa 25/100.  1/2 tab tid x 1 wk, then 1/2 in am & noon & 1 at night for a week, then 1/2 in am &1 at noon &night for a week, then 1 po tid.  Risks, benefits, side effects and alternative therapies were discussed.  The opportunity to ask questions was given and they were answered to the best of my ability.  The patient expressed understanding and willingness to follow the outlined treatment protocols.  -We will also add carbidopa/levodopa 25/100 CR at bedtime, primarily because of restless leg.  -talked about exercise.  Needs to ask cardiologist if appropriate  2.  LE edema  -not noted today but he complains about this.  Told could be due to requip.    3.  RLS  -on requip at a very large dose for restless leg (but not necessarily a large dose for Parkinson's).  Talked again about the phenomenon of augmentation.  I think he is certainly getting this.  4.  insomnia  -think more related to RLS.  Add  carbidopa/levodopa 25/100 CR  at night.    -d/c ambien.  He will likely have rebound insomnia once he stops this.  5.  CAD  -had CABG 11/2016 and grafts blocked and now undergoing stent placements.  6.  LBP  -had to hold on back sx with Dr. Vertell Limber because of #5  7.  Follow up is anticipated in the next few months, sooner should new neurologic issues arise.    Marland Kitchen Cc:  No ref. provider found

## 2017-09-28 ENCOUNTER — Encounter: Payer: Self-pay | Admitting: Neurology

## 2017-09-28 ENCOUNTER — Ambulatory Visit (INDEPENDENT_AMBULATORY_CARE_PROVIDER_SITE_OTHER): Payer: BLUE CROSS/BLUE SHIELD | Admitting: Neurology

## 2017-09-28 VITALS — BP 148/84 | HR 80 | Ht 66.5 in | Wt 205.0 lb

## 2017-09-28 DIAGNOSIS — G4700 Insomnia, unspecified: Secondary | ICD-10-CM

## 2017-09-28 DIAGNOSIS — G20 Parkinson's disease: Secondary | ICD-10-CM | POA: Diagnosis not present

## 2017-09-28 DIAGNOSIS — I25709 Atherosclerosis of coronary artery bypass graft(s), unspecified, with unspecified angina pectoris: Secondary | ICD-10-CM | POA: Diagnosis not present

## 2017-09-28 DIAGNOSIS — G2581 Restless legs syndrome: Secondary | ICD-10-CM | POA: Diagnosis not present

## 2017-09-28 MED ORDER — CARBIDOPA-LEVODOPA ER 25-100 MG PO TBCR: 1.0000 | EXTENDED_RELEASE_TABLET | Freq: Every day | ORAL | 1 refills | Status: DC

## 2017-09-28 MED ORDER — CARBIDOPA-LEVODOPA 25-100 MG PO TABS: 1.0000 | ORAL_TABLET | Freq: Three times a day (TID) | ORAL | 1 refills | Status: DC

## 2017-09-28 NOTE — Patient Instructions (Addendum)
1.  Start Carbidopa Levodopa as follows:  Take 1/2 tablet three times daily, at least 30 minutes before meals, for one week  Then take 1/2 tablet in the morning, 1/2 tablet in the afternoon, 1 tablet in the evening, at least 30 minutes before meals, for one week  Then take 1/2 tablet in the morning, 1 tablet in the afternoon, 1 tablet in the evening, at least 30 minutes before meals, for one week  Then take 1 tablet three times daily, at least 30 minutes before meals   As a reminder, carbidopa/levodopa can be taken at the same time as a carbohydrate, but we like to have you take your pill either 30 minutes before a protein source or 1 hour after as protein can interfere with carbidopa/levodopa absorption.  2.  Add carbidopa/levodopa 25/100 CR 30 min before bedtime.  Do NOT mix this version of carbidopa/levodopa 25/100 with your daytime version.  They are different medications  3.  Stop ambien  4.  You can try melatonin, 3 mg at night

## 2017-09-29 ENCOUNTER — Ambulatory Visit: Payer: Medicare Other | Admitting: Neurology

## 2017-12-05 ENCOUNTER — Telehealth: Payer: Self-pay | Admitting: Neurology

## 2017-12-05 NOTE — Telephone Encounter (Signed)
Patient called and is needing a sooner appointment. He was moved up from 02/20 to 01/03/18. He is on the wait list. He said that he feels "he cannot last that long". He said he is having Dizziness, Headaches, Weakness, Trembling, Toes numb and unable to sleep. He said he "feels a mess". Please Call. Thanks

## 2017-12-05 NOTE — Telephone Encounter (Signed)
Spoke with patient. He has multiple complaints which he states have slowly worsened over the last month. He states Carbidopa Levodopa helped symptoms at first, but now it is not helpful. He is up all night. He is able to sleep for 30 minute increments, then he has to be up moving. He is taking medication as follows:  Carbidopa Levodopa 25/100 IR: 1 at 6am, 1 at 12pm, 1 at 6pm and CR dose between 9-10 pm.  He lowered his Requip dose right after starting Levodopa. He is currently taking:  Requip 2 mg:  1/2 between 9-10am, 1/2 between 2-4pm, and 1 between 9-10pm.   He also throws out many complaints unrelated of headaches, dizziness, vision changes (with swelling under his left eye), and occasional labored breathing. He doesn't currently have a PCP. He has had cardiac stents since last visit (3 months, and 2 months ago). He also complained of back pain that is managed by Dr. Maryjean Ka.   Please advise.

## 2017-12-05 NOTE — Telephone Encounter (Signed)
1.  He needs a PCP.  I cannot be his PCP. 2.  Did he stop his [place]? 3.  If I remember, he lives far away.  Is there someone near where he lives that we can refer him to for the restless leg part (sleep specialist).  That is his biggest c/o.

## 2017-12-06 NOTE — Telephone Encounter (Signed)
Spoke with patient who was not satisfied with this advise.  He states he only took [place] for a week and didn't like the side effects.  I offered referral to sleep specialist about RLS and he states he has seen someone before and wasn't interested. He states he came here to be managed by neurology for his symptoms and he feels like he got better for a bit with medication and now he isn't and he is very frustrated with this answer.  He is aware he is still on the cancellation list. He was moved two months sooner. Dr. Carles Collet will address stiffness/tremor at follow up. He was advised to call Cardiology about dizziness since recent stent placement and since acutely worse and urged to establish care with PCP since having multiple complaints that don't all sound neurologic.  He still was not happy with advise, but agreed to proceed.

## 2017-12-12 ENCOUNTER — Telehealth: Payer: Self-pay | Admitting: Neurology

## 2017-12-12 NOTE — Telephone Encounter (Signed)
Spoke with patient's wife. Made her aware that I only advised him to contact Cardiology because he complained of acute onset of dizziness and he has a history of recent stent placement. I advised her that I discussed him needing a PCP. That we already moved his appt 2 months earlier and he is still on cancellation list. We discussed that not all his symptoms are PD related and he has a very long list of various complaints. Wife expresses understanding.

## 2017-12-12 NOTE — Telephone Encounter (Signed)
Patient's wife called and is needing to let Dr. Carles Patterson know that his Cardiologist is wanting him to follow up with Dr. Carles Patterson. The Cardiologist is saying it's Autonomic Dysfunction with Parkinson's and not related to his heart? His wife is unsure where he should go to be seen? Please Call. Thanks

## 2018-01-02 NOTE — Progress Notes (Signed)
Brett Patterson was seen today in the movement disorders clinic for neurologic consultation at the request of No ref. provider found.  The consultation is for the evaluation of tremor.  This patient is accompanied in the office by his spouse who supplements the history.The records that were made available to me were reviewed.  Pt has previously been evaluated by neurology in [place].  He brings some of his records.  However, EMG was dated April, 2014.  This suggested a generalized peripheral neuropathy.  There was also part of a note indicating restless leg from March, 2015 and he was on Requip at that time, 1 mg 3 times per day.  He is currently on requip 2 mg qid to 5 times a day (last in the middle of the night).  Does state that he was seen at [facility] and told that he may have Parkinson's disease due to what sounds like mask facies.  I searched care everywhere and it appears that the last time he was seen by neurology at [facility] was 2006 and that was for low back pain.  He has seen neurosurgery   Specific Symptoms:  Tremor: Yes.  , 1 year.  L hand but some in the R thumb now.  Notices it when walking.  Trouble with keeping food on the fork.  He was put on requip 3 years ago for RLS and has increased dosage with time.  It "possibly" helps.  He has trouble describing the feeling of RLS but does state that he has to get up and move the legs and shake the hands and he will need to get up and "pop an extra requip."  State that after CABG, "neuropathy" was better for 3 weeks but it came back. Family hx of similar:  No. Voice: no change Sleep:   Vivid Dreams:  Yes.  , bad dreams  Acting out dreams:  Yes.  , some yelling Wet Pillows: No. but has nighttime drooling Postural symptoms:  No. (some from chronic LBP)  Falls?  No. but sometimes feels like someone is behind him and pulling him backward Bradykinesia symptoms: slow movements and difficulty getting out of a chair Loss of smell:  No. Loss of  taste:  "food doesn't taste like it used to" Urinary Incontinence:  No. Difficulty Swallowing:  No. Handwriting, micrographia: "maybe a little smaller" Trouble with ADL's:  No.  Trouble buttoning clothing: No. - some trouble with dexterity Depression:  No., but admits to some frustration with medical issues Memory changes:  Yes.  , minor short term memory change Hallucinations:  No.  visual distortions: No. N/V:  No. Lightheaded:  Yes.   - not just when first stands up  Syncope: No. Diplopia:  No. Dyskinesia:  No.  Neuroimaging of the brain has not previously been performed.   09/28/17 update: Patient is seen today in follow-up for parkinsonism.  He is accompanied by his wife who supplements the history.  He did not start on any medications for Parkinson's disease, but has been on fairly high-dose Requip (at least high-dose for restless leg) for restless leg syndrome.  He takes 2 mg 4 times per day.  He is noting more tremor.  "restless leg and neuropathy is off of the wall."  Neuropathy as described as legs moving all the left time and inability to get them comfortable.  He is having insomnia but wife also states that he says things in the middle of the night that don't make sense.  He is sleepwalking (on Ambien).  He was back in the hospital 4 weeks ago for a stent and has to have another 2 placed in a few weeks.  Told that the bypass in November didn't work and the vessels blocked back up.  Has had a few falls that he describes as slips.  Is having dizziness and some SOB.    01/03/18 update: Patient is seen today for follow-up of parkinsonism.  He is accompanied by his wife who supplements history.  He was worked in today, but that it was primarily for restless leg.  He has been experiencing augmentation from high-dose Requip, which he came to me on.  He has been on Requip, 2 mg 4 times per day but reports is now on requip 2 mg bid (reduced on own).  Last visit, I started levodopa for his  Parkinson's and added some bedtime levodopa in hopes that I could transition him off of so much daytime ropinirole at some point.  He is currently on carbidopa/levodopa 25/100, 1 tablet 3 times per day and carbidopa/levodopa 25/100 CR at bedtime.  I recommended he discontinue his Ambien at bedtime, which he has done.  He continues to have issues at night with his restless leg and reports that he is having trouble sleeping.  C/o dizziness.  Asks if post surgical fluid defect from laminectomy could be causing dizziness and lightheadedness and off balance.  States that he saw cardiology and was told it wasn't his heart and that they couldn't help with autonomic dysfunction, which they thought he had and told to talk with me.  he also c/o paresthesias in the feet/legs.  Also c/o back pain related to cauda equina syndrome, for which he sees Dr. Vertell Limber and Luan Pulling.  He cannot exercise because of pain and dizziness.  Has appt with Dr. Maryjean Ka next week but not sure that surgery is an option b/c of anticoagulation although told by cardiology recently that his heart is strong.  Also c/o active, vivid dreams.  Also c/o SOB.  Also c/o fatigue.    ALLERGIES:   Allergies  Allergen Reactions  . Codeine Other (See Comments), Anxiety and Itching    Other Reaction: Other reaction     CURRENT MEDICATIONS:  Outpatient Encounter Medications as of 01/03/2018  Medication Sig  . Alpha-Lipoic Acid 100 MG CAPS Take by mouth.  Marland Kitchen aspirin EC 81 MG tablet Take 81 mg by mouth daily.  Marland Kitchen atorvastatin (LIPITOR) 40 MG tablet Take by mouth.  . carbidopa-levodopa (SINEMET IR) 25-100 MG tablet Take 1 tablet by mouth 3 (three) times daily.  . Carbidopa-Levodopa ER (SINEMET CR) 25-100 MG tablet controlled release Take 1 tablet by mouth at bedtime.  . cholecalciferol (VITAMIN D) 1000 units tablet Take 1,000 Units by mouth daily.  . clopidogrel (PLAVIX) 75 MG tablet Take 75 mg by mouth daily.  . Coenzyme Q10 (COQ10) 100 MG CAPS Take by  mouth.  . folic acid (FOLVITE) 1 MG tablet Take 1 mg by mouth daily.  . Multiple Vitamins-Minerals (ZINC PO) Take by mouth.  . oxyCODONE-acetaminophen (PERCOCET/ROXICET) 5-325 MG tablet Take 1 tablet by mouth 2 (two) times daily at 10 AM and 5 PM.  . potassium chloride (KLOR-CON) 8 MEQ tablet Take 8 mEq by mouth 2 times daily.  Reported on 02/05/2015  . rOPINIRole (REQUIP) 2 MG tablet Take 2 mg by mouth. 1-2 times daily  . thiamine 100 MG tablet Take 100 mg by mouth daily.  . vitamin B-12 (CYANOCOBALAMIN) 1000 MCG  tablet Take 1,000 mcg by mouth daily.  . nitroGLYCERIN (NITROSTAT) 0.4 MG SL tablet PLACE ONE TABLET SUBLINGUALLY EVERY 5 MINTUES FOR CHEST PAIN, MAX 3 TABS  . rotigotine (NEUPRO) 4 MG/24HR Place 1 patch onto the skin daily.  . [DISCONTINUED] b complex vitamins tablet Take 1 tablet by mouth daily.  . [DISCONTINUED] Multiple Vitamin (MULTIVITAMIN) tablet Take 1 tablet by mouth daily.  . [DISCONTINUED] zolpidem (AMBIEN) 5 MG tablet Take 5 mg by mouth at bedtime as needed.   No facility-administered encounter medications on file as of 01/03/2018.     PAST MEDICAL HISTORY:   Past Medical History:  Diagnosis Date  . CAD (coronary artery disease)   . GERD (gastroesophageal reflux disease)   . Glaucoma    unsure if closed/open angle glaucoma  . Hyperlipidemia   . Hypertension   . Lyme disease    pt reports "chronic lyme" since the 1970s    PAST SURGICAL HISTORY:   Past Surgical History:  Procedure Laterality Date  . BICEPS TENDON REPAIR Left   . COLONOSCOPY N/A 10/13/2015   Procedure: COLONOSCOPY;  Surgeon: Danie Binder, MD;  Location: [facility];  Service: Endoscopy;  Laterality: N/A;  10:30 Am  . CORONARY ARTERY BYPASS GRAFT    . ORIF ORBITAL FRACTURE Right   . SPINAL FIXATION SURGERY W/ IMPLANT     times 5  . tendon transplant Right 13months ago    SOCIAL HISTORY:   Social History   Socioeconomic History  . Marital status: Married    Spouse name: Not on file  .  Number of children: Not on file  . Years of education: Not on file  . Highest education level: Not on file  Occupational History  . Occupation: disability    Comment: Museum/gallery curator  . Financial resource strain: Not on file  . Food insecurity:    Worry: Not on file    Inability: Not on file  . Transportation needs:    Medical: Not on file    Non-medical: Not on file  Tobacco Use  . Smoking status: Never Smoker  . Smokeless tobacco: Never Used  Substance and Sexual Activity  . Alcohol use: Yes    Comment: rare special occasions  . Drug use: No  . Sexual activity: Not on file  Lifestyle  . Physical activity:    Days per week: Not on file    Minutes per session: Not on file  . Stress: Not on file  Relationships  . Social connections:    Talks on phone: Not on file    Gets together: Not on file    Attends religious service: Not on file    Active member of club or organization: Not on file    Attends meetings of clubs or organizations: Not on file    Relationship status: Not on file  . Intimate partner violence:    Fear of current or ex partner: Not on file    Emotionally abused: Not on file    Physically abused: Not on file    Forced sexual activity: Not on file  Other Topics Concern  . Not on file  Social History Narrative  . Not on file    FAMILY HISTORY:   Family Status  Relation Name Status  . Mother  Alive  . Father  Deceased  . Sister  Deceased  . Brother 2 Alive  . Child 2 Alive    ROS:  Review of Systems  Constitutional: Positive  for malaise/fatigue.  HENT: Negative.   Eyes: Negative.   Cardiovascular: Negative.   Gastrointestinal: Positive for nausea.  Musculoskeletal: Positive for neck pain. Negative for back pain.  Skin: Negative.   Neurological: Positive for dizziness and tremors.     PHYSICAL EXAMINATION:    VITALS:   Vitals:   01/03/18 0752  BP: (!) 146/84  Pulse: 78  SpO2: 93%  Weight: 215 lb (97.5 kg)  Height: 5\' 7"   (1.702 m)     GEN:  The patient appears stated age and is in NAD. HEENT:  Normocephalic, atraumatic.  The mucous membranes are moist. The superficial temporal arteries are without ropiness or tenderness. CV:  RRR Lungs:  CTAB Neck/HEME:  There are no carotid bruits bilaterally.  Neurological examination:  Orientation: The patient is alert and oriented x3. Cranial nerves: There is good facial symmetry except R ptosis (chronic after trauma). There is facial hypomimia.  The speech is fluent and clear. He is hypophonic.  Soft palate rises symmetrically and there is no tongue deviation. Hearing is intact to conversational tone. Sensation: Sensation is intact to light touch throughout Motor: Strength is 5/5 in the bilateral upper and lower extremities.   Shoulder shrug is equal and symmetric.  There is no pronator drift.  Movement examination: Tone: There is mild increased tone in the bilateral UE Abnormal movements: There is LUE resting tremor only with ambulation Coordination:  There is decremation with RAM's, with any form of RAMS, including alternating supination and pronation of the forearm, hand opening and closing, finger taps on the left and heel and toe taps bilaterally Gait and Station: The patient has minimal difficulty arising out of a deep-seated chair without the use of the hands. The patient has a stooped posture.  He is slow.  He has LEFT upper extremity resting tremor with ambulation.   ASSESSMENT/PLAN:  1.  Idiopathic Parkinson's disease.  The patient has tremor, bradykinesia, rigidity and postural instability.  -continue Carbidopa/levodopa 25/100, 1 tablet 3 times per day.  May change this to CR in coming weeks due to dizziness but changing other things today.  Told him to call me in 2 weeks.  -continue Carbidopa/levodopa 25/100 CR at bedtime  -Would like to get him off of the ropinirole, which she was never on for Parkinson's disease, but rather for restless leg.  He came  to me on this and this has caused significant augmentation.  Will wean him off of this and start Neupro, 4 mg daily.  Samples given.  Risks, benefits, side effects and alternative therapies were discussed.  The opportunity to ask questions was given and they were answered to the best of my ability.  The patient expressed understanding and willingness to follow the outlined treatment protocols.  2.  Dizziness  -increase water intake  -see if better with changing requip to neupro.  If not, will change carbidopa/levodopa IR to CR in the day  3.  RLS  -As above, weaning him off of ropinirole and starting Neupro, 4 mg daily.  -Recommend referral to [facility] sleep if above not helpful  4.  CAD  -had CABG 11/2016  5.  LBP  -had to hold on back sx with Dr. Vertell Limber because of #4.  States cardiologist told him doing better.  Told him to f/u with Dr. Vertell Limber as old neuroimaging patient brought to me (2014) demonstrated evidence of cauda equina syndrome and likely is primary contributor to walking issue and pain.  He has appt next week with Dr  Harkins  6. Follow up is anticipated in the next few months, sooner should new neurologic issues arise.  Much greater than 50% of this visit was spent in counseling and coordinating care.  Total face to face time:  30 min  . Cc:  No ref. provider found

## 2018-01-03 ENCOUNTER — Encounter: Payer: Self-pay | Admitting: Neurology

## 2018-01-03 ENCOUNTER — Ambulatory Visit (INDEPENDENT_AMBULATORY_CARE_PROVIDER_SITE_OTHER): Payer: BLUE CROSS/BLUE SHIELD | Admitting: Neurology

## 2018-01-03 ENCOUNTER — Encounter

## 2018-01-03 VITALS — BP 146/84 | HR 78 | Ht 67.0 in | Wt 215.0 lb

## 2018-01-03 DIAGNOSIS — M545 Low back pain, unspecified: Secondary | ICD-10-CM

## 2018-01-03 DIAGNOSIS — G20 Parkinson's disease: Secondary | ICD-10-CM | POA: Diagnosis not present

## 2018-01-03 DIAGNOSIS — I25709 Atherosclerosis of coronary artery bypass graft(s), unspecified, with unspecified angina pectoris: Secondary | ICD-10-CM

## 2018-01-03 DIAGNOSIS — G4752 REM sleep behavior disorder: Secondary | ICD-10-CM

## 2018-01-03 DIAGNOSIS — G2581 Restless legs syndrome: Secondary | ICD-10-CM | POA: Diagnosis not present

## 2018-01-03 MED ORDER — ROTIGOTINE 4 MG/24HR TD PT24: 1.0000 | MEDICATED_PATCH | Freq: Every day | TRANSDERMAL | 3 refills | Status: DC

## 2018-01-03 MED ORDER — ROTIGOTINE 4 MG/24HR TD PT24: 1.0000 | MEDICATED_PATCH | Freq: Every day | TRANSDERMAL | 0 refills | Status: DC

## 2018-01-03 NOTE — Patient Instructions (Signed)
1. Stop Requip and start Neupro 4 mg patches. Call in 2 weeks to let us know how you are doing.

## 2018-01-03 NOTE — Addendum Note (Signed)
Addended byAnnamaria Helling on: 01/03/2018 10:19 AM   Modules accepted: Orders

## 2018-01-04 HISTORY — PX: STENT PLACEMENT VASCULAR (ARMC HX): HXRAD1737

## 2018-02-10 ENCOUNTER — Ambulatory Visit: Payer: Medicare Other | Admitting: Neurology

## 2018-03-23 ENCOUNTER — Other Ambulatory Visit: Payer: Self-pay | Admitting: Neurology

## 2018-05-02 ENCOUNTER — Encounter: Payer: Self-pay | Admitting: Neurology

## 2018-05-11 ENCOUNTER — Other Ambulatory Visit: Payer: Self-pay | Admitting: Neurology

## 2018-05-11 NOTE — Telephone Encounter (Signed)
Let pt know that I got a RF request and I RF x 30 days but he needs evisit (I think you have already been trying to call him about that)

## 2018-05-11 NOTE — Progress Notes (Signed)
Virtual Visit via Video Note The purpose of this virtual visit is to provide medical care while limiting exposure to the novel coronavirus.    Consent was obtained for video visit:  Yes.   Answered questions that patient had about telehealth interaction:  Yes.   I discussed the limitations, risks, security and privacy concerns of performing an evaluation and management service by telemedicine. I also discussed with the patient that there may be a patient responsible charge related to this service. The patient expressed understanding and agreed to proceed.  Pt location: Home Physician Location: office Name of referring provider:  No ref. provider found I connected with [name] at patients initiation/request on 05/15/2018 at  9:30 AM EDT by video enabled telemedicine application and verified that I am speaking with the correct person using two identifiers. Pt MRN:  [id number] Pt DOB:  [birth year] Video Participants:  [name];     History of Present Illness:  Patient is seen today in follow-up for Parkinson's disease.  The patient is on carbidopa/levodopa 25/100, 1 tablet 3 times per day and carbidopa/levodopa 25/100 CR at bedtime.  Pt denies falls.  Pt denies lightheadedness, near syncope.  No hallucinations.  Mood has been good.  Last time, I was trying to get him off of ropinirole (came to me on the medication) as he was getting a significant amount of augmentation from the medication.  I switched him to the rotigotine patch.  He is currently on 4 mg daily.  "It works better than the requip was[employer] issue is he is hairy and has to shave.  He seems to go through cycles where RLS is better and worse.  He denies sleep attacks.  He denies compulsive behaviors.  No falls.  Does have dizziness.  BP has been running pretty low.  In the 90's to 100's SBP.  Still with back pain.  Has appt with Dr. Maryjean Ka in June and still considering sx with Dr. Vertell Limber.  Mom just died in SNF last  week from covid.  Noting some vivid dreams  Current Outpatient Medications on File Prior to Visit  Medication Sig Dispense Refill  . Alpha-Lipoic Acid 100 MG CAPS Take by mouth.    Marland Kitchen aspirin EC 81 MG tablet Take 81 mg by mouth daily.    . carbidopa-levodopa (SINEMET IR) 25-100 MG tablet TAKE 1 TABLET BY MOUTH THREE TIMES A DAY 270 tablet 1  . Carbidopa-Levodopa ER (SINEMET CR) 25-100 MG tablet controlled release TAKE 1 TABLET BY MOUTH EVERYDAY AT BEDTIME 90 tablet 1  . cholecalciferol (VITAMIN D) 1000 units tablet Take 1,000 Units by mouth daily.    . clopidogrel (PLAVIX) 75 MG tablet Take 75 mg by mouth daily.    . Coenzyme Q10 (COQ10) 100 MG CAPS Take by mouth.    . folic acid (FOLVITE) 1 MG tablet Take 1 mg by mouth daily.    . Multiple Vitamins-Minerals (ZINC PO) Take by mouth.    . NEUPRO 4 MG/24HR APPLY 1 PATCH ONTO THE SKIN EVERY DAY 30 patch 0  . nitroGLYCERIN (NITROSTAT) 0.4 MG SL tablet PLACE ONE TABLET SUBLINGUALLY EVERY 5 MINTUES FOR CHEST PAIN, MAX 3 TABS  3  . oxyCODONE-acetaminophen (PERCOCET/ROXICET) 5-325 MG tablet Take 1 tablet by mouth 2 (two) times daily at 10 AM and 5 PM.    . potassium chloride (KLOR-CON) 8 MEQ tablet Take 8 mEq by mouth 2 times daily.  Reported on 02/05/2015    . thiamine 100  MG tablet Take 100 mg by mouth daily.    . vitamin B-12 (CYANOCOBALAMIN) 1000 MCG tablet Take 1,000 mcg by mouth daily.    Marland Kitchen atorvastatin (LIPITOR) 40 MG tablet Take by mouth.     No current facility-administered medications on file prior to visit.       Observations/Objective:   Vitals:   05/15/18 0914  BP: 109/62  Pulse: 75  Weight: 205 lb (93 kg)  Height: 5\' 7"  (1.702 m)   GEN:  The patient appears stated age and is in NAD.  Neurological examination:  Orientation: The patient is alert and oriented x3. Cranial nerves: There is good facial symmetry. There is mild facial hypomimia.  The speech is fluent and clear. Soft palate rises symmetrically and there is no tongue  deviation. Hearing is intact to conversational tone. Motor: Strength is at least antigravity x 4.   Shoulder shrug is equal and symmetric.  There is no pronator drift.  Movement examination: Tone: unable Abnormal movements: Left upper extremity resting tremor Coordination:  There is  decremation with RAM's, with any form of RAMS, including alternating supination and pronation of the forearm, hand opening and closing, finger taps, heel taps and toe taps on the left and mild with finger taps on the right. Gait and Station: The patient has no difficulty arising out of a deep-seated chair without the use of the hands. The patient's stride length with stooped posture and decreased arm swing bilaterally, left more than right.      Assessment and Plan:   1.  Parkinson's disease  -Increase carbidopa/levodopa 25/100 7am/11am/3pm/7pm (increase by 1 tablet per day)  -Continue carbidopa/levodopa 25/100 CR at bedtime (may increase this in the future due to nighttime RLS)  -having trouble with hand dexterity but doesn't want OT right now.  2.  RLS  -On rotigotine patch, 4 mg daily  -Off of Requip, which caused significant augmentation.  3.  Low back pain  -Was holding off on back surgery because of prior coronary disease.  Has appt with Dr. Rolanda Jay in June to consider surgery again.  4.  Dizziness  -told him could be related to low bP from PD but following with cardiology due to CAD and is s/p CABG.  Asked pt to keep track of sitting and standing BP for the cardiologist.  Asked pt to make appt with cardiologist (in [place])  -increase hydration  5.  REM behavior disorder  -This is commonly associated with PD and the patient is experiencing this.  We discussed that this can be very serious and even harmful.  We talked about medications as well as physical barriers to put in the bed (particularly soft bed rails, pillow barriers).  We talked about moving the night stand so that it is not so close  to the side of the bed.  Would like to avoid more medication in him right now.  Follow Up Instructions:  Follow-up in 5 months  -I discussed the assessment and treatment plan with the patient. The patient was provided an opportunity to ask questions and all were answered. The patient agreed with the plan and demonstrated an understanding of the instructions.   The patient was advised to call back or seek an in-person evaluation if the symptoms worsen or if the condition fails to improve as anticipated.    Total Time spent in visit with the patient was: 25 minutes, of which more than 50% of the time was spent in counseling and/or coordinating care on safety.  Pt understands and agrees with the plan of care outlined.     Alonza Bogus, DO

## 2018-05-12 ENCOUNTER — Encounter: Payer: Self-pay | Admitting: *Deleted

## 2018-05-15 ENCOUNTER — Telehealth (INDEPENDENT_AMBULATORY_CARE_PROVIDER_SITE_OTHER): Payer: BC Managed Care – PPO | Admitting: Neurology

## 2018-05-15 ENCOUNTER — Other Ambulatory Visit: Payer: Self-pay

## 2018-05-15 ENCOUNTER — Encounter: Payer: Self-pay | Admitting: Neurology

## 2018-05-15 DIAGNOSIS — G20 Parkinson's disease: Secondary | ICD-10-CM | POA: Diagnosis not present

## 2018-05-15 DIAGNOSIS — R42 Dizziness and giddiness: Secondary | ICD-10-CM | POA: Diagnosis not present

## 2018-05-15 DIAGNOSIS — G20A1 Parkinson's disease without dyskinesia, without mention of fluctuations: Secondary | ICD-10-CM

## 2018-05-15 DIAGNOSIS — G4752 REM sleep behavior disorder: Secondary | ICD-10-CM

## 2018-05-15 MED ORDER — ROTIGOTINE 4 MG/24HR TD PT24: 1.0000 | MEDICATED_PATCH | Freq: Every day | TRANSDERMAL | 1 refills | Status: DC

## 2018-05-15 MED ORDER — CARBIDOPA-LEVODOPA 25-100 MG PO TABS
1.0000 | ORAL_TABLET | Freq: Four times a day (QID) | ORAL | 1 refills | Status: DC
Start: 2018-05-15 — End: 2019-03-07

## 2018-05-15 MED ORDER — CARBIDOPA-LEVODOPA ER 25-100 MG PO TBCR
1.0000 | EXTENDED_RELEASE_TABLET | Freq: Every day | ORAL | 1 refills | Status: DC
Start: 2018-05-15 — End: 2018-10-19

## 2018-05-19 ENCOUNTER — Ambulatory Visit: Payer: BLUE CROSS/BLUE SHIELD | Admitting: Neurology

## 2018-05-19 ENCOUNTER — Encounter

## 2018-07-11 ENCOUNTER — Other Ambulatory Visit: Payer: Self-pay

## 2018-07-11 ENCOUNTER — Emergency Department (HOSPITAL_COMMUNITY)
Admission: EM | Admit: 2018-07-11 | Discharge: 2018-07-11 | Disposition: A | Discharge status: Home / Self Care | Payer: BC Managed Care – PPO | Attending: Emergency Medicine | Admitting: Emergency Medicine

## 2018-07-11 ENCOUNTER — Encounter (HOSPITAL_COMMUNITY): Payer: Self-pay | Admitting: Emergency Medicine

## 2018-07-11 DIAGNOSIS — Z951 Presence of aortocoronary bypass graft: Secondary | ICD-10-CM | POA: Insufficient documentation

## 2018-07-11 DIAGNOSIS — Z79899 Other long term (current) drug therapy: Secondary | ICD-10-CM | POA: Insufficient documentation

## 2018-07-11 DIAGNOSIS — R0981 Nasal congestion: Secondary | ICD-10-CM | POA: Diagnosis not present

## 2018-07-11 DIAGNOSIS — G20 Parkinson's disease: Secondary | ICD-10-CM | POA: Insufficient documentation

## 2018-07-11 DIAGNOSIS — I10 Essential (primary) hypertension: Secondary | ICD-10-CM | POA: Insufficient documentation

## 2018-07-11 DIAGNOSIS — I2510 Atherosclerotic heart disease of native coronary artery without angina pectoris: Secondary | ICD-10-CM | POA: Insufficient documentation

## 2018-07-11 DIAGNOSIS — Z7982 Long term (current) use of aspirin: Secondary | ICD-10-CM | POA: Diagnosis not present

## 2018-07-11 DIAGNOSIS — Z0389 Encounter for observation for other suspected diseases and conditions ruled out: Secondary | ICD-10-CM | POA: Diagnosis present

## 2018-07-11 HISTORY — DX: Parkinson's disease without dyskinesia, without mention of fluctuations: G20.A1

## 2018-07-11 HISTORY — DX: Parkinson's disease: G20

## 2018-07-11 LAB — BASIC METABOLIC PANEL
Anion gap: 11 (ref 5–15)
BUN: 18 mg/dL (ref 8–23)
CO2: 26 mmol/L (ref 22–32)
Calcium: 9.7 mg/dL (ref 8.9–10.3)
Chloride: 104 mmol/L (ref 98–111)
Creatinine, Ser: 0.95 mg/dL (ref 0.61–1.24)
GFR calc Af Amer: >60 mL/min (ref >60)
GFR calc non Af Amer: >60 mL/min (ref >60)
Glucose, Bld: 104 mg/dL — ABNORMAL HIGH (ref 70–99)
Potassium: 3.9 mmol/L (ref 3.5–5.1)
Sodium: 141 mmol/L (ref 135–145)

## 2018-07-11 LAB — CBC WITH DIFFERENTIAL/PLATELET
Abs Immature Granulocytes: 0.02 10*3/uL (ref 0.00–0.07)
Basophils Absolute: 0 10*3/uL (ref 0.0–0.1)
Basophils Relative: 1 %
Eosinophils Absolute: 0 10*3/uL (ref 0.0–0.5)
Eosinophils Relative: 1 %
HCT: 47.6 % (ref 39.0–52.0)
Hemoglobin: 15.4 g/dL (ref 13.0–17.0)
Immature Granulocytes: 0 %
Lymphocytes Relative: 22 %
Lymphs Abs: 1.7 10*3/uL (ref 0.7–4.0)
MCH: 30 pg (ref 26.0–34.0)
MCHC: 32.4 g/dL (ref 30.0–36.0)
MCV: 92.8 fL (ref 80.0–100.0)
Monocytes Absolute: 0.5 10*3/uL (ref 0.1–1.0)
Monocytes Relative: 7 %
Neutro Abs: 5.4 10*3/uL (ref 1.7–7.7)
Neutrophils Relative %: 69 %
Platelets: 243 10*3/uL (ref 150–400)
RBC: 5.13 MIL/uL (ref 4.22–5.81)
RDW: 13.7 % (ref 11.5–15.5)
WBC: 7.7 10*3/uL (ref 4.0–10.5)
nRBC: 0 % (ref 0.0–0.2)

## 2018-07-11 NOTE — ED Provider Notes (Signed)
[facility] EMERGENCY DEPARTMENT Provider Note   CSN: [id number] Arrival date & time: 07/11/18  1525    History   Chief Complaint Chief Complaint  Patient presents with  . Foreign Body in [place] is a 68 y.o. male with a history of CAD sp cabg ([facility]), GERD, HTN, chronic Lyme disease and Parkinsons presenting with suspected nasal parasites.  He reports for approximately the past year he has had episodic white ropy nasal exudate upon blowing his nose, sometimes difficult to remove, and noticed several days ago movement in one of the specimens.  He presents with several samples in small vials floating in alcohol and the one that had movement, he preserved between 2 glass slides.  He reports frequent trips to the [place] over the years, lots of water sports including diving and is concerned he may have picked up a parasite.  He denies fevers, chills, headaches or sinus pain but does have frequent nasal congestion.      The history is provided by the patient.  Foreign Body in Nose Pertinent negatives include no chest pain, no abdominal pain, no headaches and no shortness of breath.    Past Medical History:  Diagnosis Date  . CAD (coronary artery disease)   . GERD (gastroesophageal reflux disease)   . Glaucoma    unsure if closed/open angle glaucoma  . Hyperlipidemia   . Hypertension   . Lyme disease    pt reports "chronic lyme" since the 1970s  . Parkinson disease [facility])     Patient Active Problem List   Diagnosis Date Noted  . Special screening for malignant neoplasms, colon     Past Surgical History:  Procedure Laterality Date  . BICEPS TENDON REPAIR Left   . COLONOSCOPY N/A 10/13/2015   Procedure: COLONOSCOPY;  Surgeon: Danie Binder, MD;  Location: [facility] SUITE;  Service: Endoscopy;  Laterality: N/A;  10:30 Am  . CORONARY ARTERY BYPASS GRAFT    . ORIF ORBITAL FRACTURE Right   . SPINAL FIXATION SURGERY W/ IMPLANT     times 5  . tendon  transplant Right 68months ago        Home Medications    Prior to Admission medications   Medication Sig Start Date End Date Taking? Authorizing Provider  Alpha-Lipoic Acid 100 MG CAPS Take by mouth.    [provider]  aspirin EC 81 MG tablet Take 81 mg by mouth daily.    [provider]  atorvastatin (LIPITOR) 40 MG tablet Take by mouth. 11/14/16 05/12/18  [provider]  carbidopa-levodopa (SINEMET IR) 25-100 MG tablet Take 1 tablet by mouth 4 (four) times daily. 05/15/18   Tat, Eustace Quail, DO  Carbidopa-Levodopa ER (SINEMET CR) 25-100 MG tablet controlled release Take 1 tablet by mouth at bedtime. 05/15/18   Tat, Eustace Quail, DO  cholecalciferol (VITAMIN D) 1000 units tablet Take 1,000 Units by mouth daily.    [provider]  clopidogrel (PLAVIX) 75 MG tablet Take 75 mg by mouth daily. 09/01/17 09/01/18  [provider]  Coenzyme Q10 (COQ10) 100 MG CAPS Take by mouth.    [provider]  folic acid (FOLVITE) 1 MG tablet Take 1 mg by mouth daily.    [provider]  Multiple Vitamins-Minerals (ZINC PO) Take by mouth.    [provider]  nitroGLYCERIN (NITROSTAT) 0.4 MG SL tablet PLACE ONE TABLET SUBLINGUALLY EVERY 5 MINTUES FOR CHEST PAIN, MAX 3 TABS 02/20/17   [provider]  oxyCODONE-acetaminophen (PERCOCET/ROXICET) 5-325 MG tablet Take 1 tablet by mouth 2 (two) times daily at 10 AM and 5 PM.    [provider]  potassium chloride (KLOR-CON) 8 MEQ tablet Take 8 mEq by mouth 2 times daily.  Reported on 02/05/2015    [provider]  rotigotine (NEUPRO) 4 MG/24HR Place 1 patch onto the skin daily. 05/15/18   Tat, Eustace Quail, DO  thiamine 100 MG tablet Take 100 mg by mouth daily.    [provider]  vitamin B-12 (CYANOCOBALAMIN) 1000 MCG tablet Take 1,000 mcg by mouth daily.    [provider]    Family History Family History  Problem Relation Age of Onset  . Alzheimer's disease  Mother   . Pancreatic cancer Father   . Melanoma Sister     Social History Social History   Tobacco Use  . Smoking status: Never Smoker  . Smokeless tobacco: Never Used  Substance Use Topics  . Alcohol use: Yes    Comment: rare special occasions  . Drug use: No     Allergies   Codeine   Review of Systems Review of Systems  Constitutional: Negative for chills and fever.  HENT: Positive for congestion. Negative for rhinorrhea, sinus pain and sore throat.   Eyes: Negative.   Respiratory: Negative for chest tightness and shortness of breath.   Cardiovascular: Negative for chest pain.  Gastrointestinal: Negative for abdominal pain and nausea.  Genitourinary: Negative.   Musculoskeletal: Negative for arthralgias, joint swelling and neck pain.  Skin: Negative.  Negative for rash and wound.  Neurological: Negative for dizziness, weakness, light-headedness, numbness and headaches.  Psychiatric/Behavioral: Negative.      Physical Exam Updated Vital Signs BP (!) 164/96 (BP Location: Right Arm)   Pulse 82   Temp 98.4 F (36.9 C) (Oral)   Resp 16   Ht 5\' 7"  (1.702 m)   Wt 91.6 kg   SpO2 99%   BMI 31.64 kg/m   Physical Exam Vitals signs and nursing note reviewed.  Constitutional:      Appearance: He is well-developed.  HENT:     Head: Normocephalic and atraumatic.     Nose: Congestion present. No rhinorrhea.     Right Nostril: No foreign body.     Left Nostril: No foreign body.     Comments: No sinus ttp. No foreign body appreciated in nostrils.  Specimens at bedside do have the appearance of organic material.  Solid small white substances in alcohol.  The slide specimen does appear to be a small worm under magnification. Eyes:     Conjunctiva/sclera: Conjunctivae normal.  Neck:     Musculoskeletal: Normal range of motion.  Cardiovascular:     Rate and Rhythm: Normal rate and regular rhythm.     Heart sounds: Normal heart sounds.  Pulmonary:     Effort:  Pulmonary effort is normal.     Breath sounds: Normal breath sounds. No wheezing.  Abdominal:     General: Bowel sounds are normal.     Palpations: Abdomen is soft.     Tenderness: There is no abdominal tenderness.  Musculoskeletal: Normal range of motion.  Skin:    General: Skin is warm and dry.  Neurological:     Mental Status: He is alert.      ED Treatments / Results  Labs (all labs ordered are listed, but only abnormal results are displayed) Labs Reviewed  BASIC METABOLIC PANEL - Abnormal; Notable for the following components:  Result Value   Glucose, Bld 104 (*)    All other components within normal limits  CBC WITH DIFFERENTIAL/PLATELET    EKG None  Radiology No results found.  Procedures Procedures (including critical care time)  Medications Ordered in ED Medications - No data to display   Initial Impression / Assessment and Plan / ED Course  I have reviewed the triage vital signs and the nursing notes.  Pertinent labs & imaging results that were available during my care of the patient were reviewed by me and considered in my medical decision making (see chart for details).        Pt with chronic nasal congestion with presentation and history suggesting possible helminthic nasal/sinus infection.  Discussed with Dr.Shoemaker who will see pt in office, will plan office CT and endoscopy.  If there is an infection, will require surgical debridement, antihelminthics would not be effective.  Gentle saline spay prn.  Pt understands and agrees with plan. Will call his office in the am to arrange office visit.  Final Clinical Impressions(s) / ED Diagnoses   Final diagnoses:  Nasal congestion    ED Discharge Orders    None       Landis Martins 07/11/18 1904    Maudie Flakes, MD 07/12/18 1036

## 2018-07-11 NOTE — ED Triage Notes (Signed)
Pt reports he has worms/ parasites coming out of his nose x 1year

## 2018-07-11 NOTE — Discharge Instructions (Addendum)
You may use nasal saline spray to try to gently clean your nose of any debris, but avoid any aggressive blowing or "sinus rinse's".  Call Dr. Wilburn Cornelia as discussed.

## 2018-07-11 NOTE — ED Notes (Signed)
Pt wife called and given update.

## 2018-07-13 ENCOUNTER — Other Ambulatory Visit: Payer: Self-pay | Admitting: Otolaryngology

## 2018-07-13 ENCOUNTER — Ambulatory Visit
Admission: RE | Admit: 2018-07-13 | Discharge: 2018-07-13 | Disposition: A | Discharge status: Home / Self Care | Payer: Medicare Other | Source: Ambulatory Visit | Attending: Otolaryngology | Admitting: Otolaryngology

## 2018-07-13 ENCOUNTER — Ambulatory Visit
Admission: RE | Admit: 2018-07-13 | Discharge: 2018-07-13 | Disposition: A | Discharge status: Home / Self Care | Payer: BC Managed Care – PPO | Source: Ambulatory Visit | Attending: Otolaryngology | Admitting: Otolaryngology

## 2018-07-13 DIAGNOSIS — J329 Chronic sinusitis, unspecified: Secondary | ICD-10-CM

## 2018-07-20 ENCOUNTER — Other Ambulatory Visit: Payer: Self-pay | Admitting: Otolaryngology

## 2018-07-21 ENCOUNTER — Other Ambulatory Visit: Payer: Self-pay | Admitting: Otolaryngology

## 2018-09-05 NOTE — Progress Notes (Signed)
Virtual Visit via Video Note The purpose of this virtual visit is to provide medical care while limiting exposure to the novel coronavirus.    Consent was obtained for video visit:  Yes.   Answered questions that patient had about telehealth interaction:  Yes.   I discussed the limitations, risks, security and privacy concerns of performing an evaluation and management service by telemedicine. I also discussed with the patient that there may be a patient responsible charge related to this service. The patient expressed understanding and agreed to proceed.  Pt location: Home Physician Location: home Name of referring provider:  No ref. provider found I connected with [name] at patients initiation/request on 09/06/2018 at  9:15 AM EDT by video enabled telemedicine application and verified that I am speaking with the correct person using two identifiers. Pt MRN:  [id number] Pt DOB:  05-19-1950 Video Participants:  [name];     History of Present Illness:  Patient seen today in follow-up for Parkinson's disease.  Last visit, I increased his carbidopa/levodopa 25/100, so that he was taking an extra tablet per day, at 7 AM/11 AM/3 PM/7 PM and continued his carbidopa/levodopa 25/100 CR at bedtime.  He reports that "I think that this is progressing."  Noting more tremor, but he does think that tremor increases with his back pain.  Decided not to do surgery for now, as he has had multiple surgeries in the past.  He is also on the rotigotine patch, 4 mg daily.  Denies compulsive behaviors or sleep attacks.  Last visit, he was complaining about significant dizziness, and I asked him to make an appointment with his cardiologist, while also keeping track of his blood pressure.  He sees a cardiologist in [place].  I did review his cardiology records through care everywhere.  He followed up with his cardiologist via a telemedicine visit on May 28, which was soon after he saw me.  It does  not appear that dizziness was really addressed, but rather shortness of breath.  Pt does state that they addressed dizziness but does admit to continued SOB, as well as significant dizziness.  Pt is on no BP meds now.  Pt states that his bp was running 112/68 but he was at dr on Monday and it was [id number] systolic.  States that his vision has not been as good.  Has been following with the eye doctor.  States that this has been complicated by the fact that he had to be treated for parasites (tapeworms) because he spent so much time in the [place].  They are wondering if that is not affecting his vision as well.   Current Outpatient Medications on File Prior to Visit  Medication Sig Dispense Refill   Alpha-Lipoic Acid 100 MG CAPS Take by mouth.     aspirin EC 81 MG tablet Take 81 mg by mouth daily.     carbidopa-levodopa (SINEMET IR) 25-100 MG tablet Take 1 tablet by mouth 4 (four) times daily. 360 tablet 1   Carbidopa-Levodopa ER (SINEMET CR) 25-100 MG tablet controlled release Take 1 tablet by mouth at bedtime. 90 tablet 1   cholecalciferol (VITAMIN D) 1000 units tablet Take 1,000 Units by mouth daily.     clopidogrel (PLAVIX) 75 MG tablet Take by mouth.     Coenzyme Q10 (COQ10) 100 MG CAPS Take by mouth.     folic acid (FOLVITE) 1 MG tablet Take 1 mg by mouth daily.     ketoconazole (NIZORAL) 2 %  shampoo WASH HAIR AND AROUND NOSE 3 TIMES A WEEK  APPLY TO SCALP LATHER, LET SET FOR 10 MINS, THEN RINSE     Multiple Vitamins-Minerals (ZINC PO) Take by mouth.     nitroGLYCERIN (NITROSTAT) 0.4 MG SL tablet PLACE ONE TABLET SUBLINGUALLY EVERY 5 MINTUES FOR CHEST PAIN, MAX 3 TABS  3   oxyCODONE-acetaminophen (PERCOCET/ROXICET) 5-325 MG tablet Take 1 tablet by mouth 2 (two) times daily at 10 AM and 5 PM.     potassium chloride (KLOR-CON) 8 MEQ tablet Take 8 mEq by mouth 2 times daily.  Reported on 02/05/2015     rotigotine (NEUPRO) 4 MG/24HR Place 1 patch onto the skin daily. 90 patch 1    vitamin B-12 (CYANOCOBALAMIN) 1000 MCG tablet Take 1,000 mcg by mouth daily.     atorvastatin (LIPITOR) 40 MG tablet Take by mouth.     betamethasone dipropionate 0.05 % lotion APPLY TO SCALP DAILY AS NEEDED FOR ITCHING     No current facility-administered medications on file prior to visit.    Past Medical History:  Diagnosis Date   CAD (coronary artery disease)    GERD (gastroesophageal reflux disease)    Glaucoma    unsure if closed/open angle glaucoma   Hyperlipidemia    Hypertension    Lyme disease    pt reports "chronic lyme" since the 1970s   Parkinson disease ([place])    Review of Systems  Constitutional: Positive for malaise/fatigue.  HENT: Negative.   Eyes: Positive for blurred vision.  Respiratory: Positive for shortness of breath.   Gastrointestinal: Negative.   Genitourinary: Negative.   Skin: Negative.   Neurological: Positive for dizziness and tremors.    Observations/Objective:   Vitals:   09/06/18 0821  Weight: 192 lb (87.1 kg)  Height: 5\' 7"  (1.702 m)   GEN:  The patient appears stated age and is in NAD.  Neurological examination:  Orientation: The patient is alert and oriented x3. Cranial nerves: There is good facial symmetry. There is significant facial hypomimia.  The speech is fluent and clear.  He is hypophonic soft palate rises symmetrically and there is no tongue deviation. Hearing is intact to conversational tone. Motor: Strength is at least antigravity x 4.   Shoulder shrug is equal and symmetric.  There is no pronator drift.  Movement examination: Tone: unable Abnormal movements: There is left upper extremity resting tremor, that significantly increases with ambulation. Coordination:  There is  decremation with RAM's, with hand opening and closing bilaterally and finger taps bilaterally, left more than right Gait and Station: The patient pushes off of the chair to arise.  He has a stooped posture.  He has left upper extremity resting  tremor with ambulation.    Assessment and Plan:   1.  Parkinson's disease             -Continue carbidopa/levodopa 25/100, 1 tablet 4 times per day.             -Continue carbidopa/levodopa 25/100 CR at bedtime (may increase this in the future due to nighttime RLS)             -refuses PT/OT, although I do think that this could help.  -I talked to the patient about the logistics associated with DBS therapy, as I think potentially he could benefit from this..  I talked to the patient about risks/benefits/side effects of DBS therapy.  We talked about risks which included but were not limited to infection, paralysis, intraoperative seizure, death, stroke,  bleeding around the electrode.   I talked to patient about fiducial placement 1 week prior to DBS therapy.  I talked to the patient about what to expect in the operating room, including the fact that this is an awake surgery.  We talked about battery placement as well as which is done under general anesthesia, generally approximately one week following the initial surgery.  We also talked about the fact that the patient will need to be off of medications for surgery.  He will think about this option.  He would certainly need cardiac clearance given coronary artery disease history/CABG  -Stop rotigotine patch, 4 mg daily.  Call me in 1 week and let me know if the dizziness got any better.  If not, I may change the daytime levodopa to CR.  He will also let me know about motor function in about a week.  2.  RLS             -Going to go ahead and stop his rotigotine patch for a week.  Told him his restless leg may get worse, at which point I may have to increase his bedtime levodopa, or add clonazepam.  I did not want to make more than 1 change today             -Off of Requip, which caused significant augmentation.  3.  Low back pain             -following with Dr. Rolanda Jay.  Holding on surgery.  Has had previous multiple surgeries  4.  Vision  change  -Following with ophthalmology.  Was treated for parasites given the time he spent in the [place] and patient states that they are wondering if parasite/tapeworm are affecting the vision.  I do not have any of the ophthalmology notes.  Follow Up Instructions:  I asked the patient to try and make an in-person follow-up, if possible, next visit as the last several visits have been via telemedicine, which is not ideal.  -I discussed the assessment and treatment plan with the patient. The patient was provided an opportunity to ask questions and all were answered. The patient agreed with the plan and demonstrated an understanding of the instructions.   The patient was advised to call back or seek an in-person evaluation if the symptoms worsen or if the condition fails to improve as anticipated.      Alonza Bogus, DO

## 2018-09-06 ENCOUNTER — Telehealth (INDEPENDENT_AMBULATORY_CARE_PROVIDER_SITE_OTHER): Payer: BC Managed Care – PPO | Admitting: Neurology

## 2018-09-06 ENCOUNTER — Other Ambulatory Visit: Payer: Self-pay

## 2018-09-06 ENCOUNTER — Encounter: Payer: Self-pay | Admitting: Neurology

## 2018-09-06 VITALS — Ht 67.0 in | Wt 192.0 lb

## 2018-09-06 DIAGNOSIS — G20 Parkinson's disease: Secondary | ICD-10-CM | POA: Diagnosis not present

## 2018-09-06 DIAGNOSIS — G2581 Restless legs syndrome: Secondary | ICD-10-CM

## 2018-09-06 DIAGNOSIS — R42 Dizziness and giddiness: Secondary | ICD-10-CM | POA: Diagnosis not present

## 2018-09-06 DIAGNOSIS — M545 Low back pain, unspecified: Secondary | ICD-10-CM

## 2018-09-21 ENCOUNTER — Ambulatory Visit: Payer: BC Managed Care – PPO | Admitting: Neurology

## 2018-10-04 ENCOUNTER — Encounter: Payer: Self-pay | Admitting: Gastroenterology

## 2018-10-17 ENCOUNTER — Telehealth: Payer: Self-pay | Admitting: Neurology

## 2018-10-17 NOTE — Telephone Encounter (Signed)
See if he can come in Thursday at 8:15 am to discuss

## 2018-10-17 NOTE — Progress Notes (Signed)
Brett Patterson was seen today in follow up for Parkinsons disease.  Patient seen as a work in today at his request.  Patient was taken off of the rotigotine patch by me about 6 weeks ago because of dizziness.  He was supposed to call me a week after I did that and let me know how he was doing, but I did not hear from him until Tuesday, at which point he stated that his cardiologist told him to call because of dizziness.  He was only off of it for 3 days and went back on it because of RLS.   Pt states that he had a heart cath done the other day.  Pt reports that he is still on the patch but is taking it every other day.  Took himself off of carbidopa/levodopa 25/100 CR at bedtime.  He also reduced his carbidopa/levodopa 25/100, 1 po qid to 1/2 po qid 4 days ago.  He wondered if it caused SOB.   Pt denies falls.    No hallucinations.  Mood has been good.  States that he feels like eyes want to close and eyes are not "right."  No diplopia.    Current movement disorder medications: Carbidopa/levodopa 25/100, 1 tablet 4 times per day neupro qod    PREVIOUS MEDICATIONS:  carbidopa/levodopa 25/100; carbidopa/levodopa 25/100 CR q hs (pt stopped it trying to simplify regimen); ambien;  Requip; Neupro (stopped to see if it would help dizziness)  ALLERGIES:   Allergies  Allergen Reactions  . Codeine Other (See Comments), Anxiety and Itching    Other Reaction: Other reaction     CURRENT MEDICATIONS:  Outpatient Encounter Medications as of 10/19/2018  Medication Sig  . aspirin EC 81 MG tablet Take 81 mg by mouth daily.  . betamethasone dipropionate 0.05 % lotion APPLY TO SCALP DAILY AS NEEDED FOR ITCHING  . carbidopa-levodopa (SINEMET IR) 25-100 MG tablet Take 1 tablet by mouth 4 (four) times daily. (Patient taking differently: Take 0.5 tablets by mouth 4 (four) times daily. )  . cholecalciferol (VITAMIN D) 1000 units tablet Take 1,000 Units by mouth daily.  Marland Kitchen ketoconazole (NIZORAL) 2 % shampoo  WASH HAIR AND AROUND NOSE 3 TIMES A WEEK  APPLY TO SCALP LATHER, LET SET FOR 10 MINS, THEN RINSE  . nitroGLYCERIN (NITROSTAT) 0.4 MG SL tablet PLACE ONE TABLET SUBLINGUALLY EVERY 5 MINTUES FOR CHEST PAIN, MAX 3 TABS  . oxyCODONE-acetaminophen (PERCOCET/ROXICET) 5-325 MG tablet Take 1 tablet by mouth 2 (two) times daily at 10 AM and 5 PM.  . rotigotine (NEUPRO) 4 MG/24HR Place 1 patch onto the skin daily. (Patient taking differently: Place 1 patch onto the skin daily. Taken every other day)  . vitamin B-12 (CYANOCOBALAMIN) 1000 MCG tablet Take 1,000 mcg by mouth daily.  . [DISCONTINUED] Carbidopa-Levodopa ER (SINEMET CR) 25-100 MG tablet controlled release Take 1 tablet by mouth at bedtime.  Marland Kitchen atorvastatin (LIPITOR) 40 MG tablet Take by mouth.  . clopidogrel (PLAVIX) 75 MG tablet Take by mouth.  . Coenzyme Q10 (COQ10) 100 MG CAPS Take by mouth.  . folic acid (FOLVITE) 1 MG tablet Take 1 mg by mouth daily.  . Multiple Vitamins-Minerals (ZINC PO) Take by mouth.  . [DISCONTINUED] Alpha-Lipoic Acid 100 MG CAPS Take by mouth.  . [DISCONTINUED] potassium chloride (KLOR-CON) 8 MEQ tablet Take 8 mEq by mouth 2 times daily.  Reported on 02/05/2015   No facility-administered encounter medications on file as of 10/19/2018.     PAST MEDICAL  HISTORY:   Past Medical History:  Diagnosis Date  . CAD (coronary artery disease)   . GERD (gastroesophageal reflux disease)   . Glaucoma    unsure if closed/open angle glaucoma  . Hyperlipidemia   . Hypertension   . Lyme disease    pt reports "chronic lyme" since the 1970s  . Parkinson disease ([place])     PAST SURGICAL HISTORY:   Past Surgical History:  Procedure Laterality Date  . BICEPS TENDON REPAIR Left   . COLONOSCOPY N/A 10/13/2015   Procedure: COLONOSCOPY;  Surgeon: Danie Binder, MD;  Location: [facility];  Service: Endoscopy;  Laterality: N/A;  10:30 Am  . CORONARY ARTERY BYPASS GRAFT    . ORIF ORBITAL FRACTURE Right   . SPINAL FIXATION SURGERY  W/ IMPLANT     times 5  . STENT PLACEMENT VASCULAR ([place] HX)  01/2018   11/2017  . tendon transplant Right 74months ago    SOCIAL HISTORY:   Social History   Socioeconomic History  . Marital status: Married    Spouse name: Not on file  . Number of children: 2  . Years of education: Not on file  . Highest education level: Some college, no degree  Occupational History  . Occupation: disability    Comment: Museum/gallery curator  . Financial resource strain: Not on file  . Food insecurity    Worry: Not on file    Inability: Not on file  . Transportation needs    Medical: Not on file    Non-medical: Not on file  Tobacco Use  . Smoking status: Never Smoker  . Smokeless tobacco: Never Used  Substance and Sexual Activity  . Alcohol use: Yes    Comment: rare special occasions  . Drug use: No  . Sexual activity: Not on file  Lifestyle  . Physical activity    Days per week: Not on file    Minutes per session: Not on file  . Stress: Not on file  Relationships  . Social Herbalist on phone: Not on file    Gets together: Not on file    Attends religious service: Not on file    Active member of club or organization: Not on file    Attends meetings of clubs or organizations: Not on file    Relationship status: Not on file  . Intimate partner violence    Fear of current or ex partner: Not on file    Emotionally abused: Not on file    Physically abused: Not on file    Forced sexual activity: Not on file  Other Topics Concern  . Not on file  Social History Narrative  . Not on file    FAMILY HISTORY:   Family Status  Relation Name Status  . Mother  Alive  . Father  Deceased  . Sister  Deceased  . Brother 2 Alive  . Child 2 Alive    ROS:  Review of Systems  Constitutional: Negative.   Eyes: Negative.   Respiratory: Negative.   Cardiovascular: Negative.   Gastrointestinal: Negative.   Genitourinary: Negative.   Skin: Negative.   Neurological:  Positive for dizziness.    PHYSICAL EXAMINATION:    VITALS:   Vitals:   10/19/18 0803  BP: 139/87  Pulse: 82  SpO2: 100%  Weight: 197 lb 6.4 oz (89.5 kg)  Height: 5\' 6"  (1.676 m)    GEN:  The patient appears stated age and is in NAD.  HEENT:  Normocephalic, atraumatic.  The mucous membranes are moist. The superficial temporal arteries are without ropiness or tenderness. CV:  RRR Lungs:  CTAB Neck/HEME:  There are no carotid bruits bilaterally.  Neurological examination:  Orientation: The patient is alert and oriented x3. Cranial nerves: There is good facial symmetry with facial hypomimia.  EOMI but diplopia is elicited with testing.  The speech is fluent and clear. Soft palate rises symmetrically and there is no tongue deviation. Hearing is intact to conversational tone. Sensation: Sensation is intact to light touch throughout Motor: Strength is at least antigravity x4.  Movement examination: Tone: There is mod increased tone in the LUE/LLE and mild in the RUE/RLE Abnormal movements: there is LUE rest tremor Coordination:  There is  decremation with RAM's, with any form of RAMS, including alternating supination and pronation of the forearm, hand opening and closing, finger taps, heel taps and toe taps, L more than R Gait and Station: The patient has no difficulty arising out of a deep-seated chair without the use of the hands. 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     ASSESSMENT/PLAN:  1.Parkinson's disease  -underdosed as pt dramatically cut own meds thinking that it caused SOB/fatigue and dizziness but isn't better even though cut medication -change carbidopa/levodopa 25/100 IR to CR at 7am/11am/3pm/7pm (he had dropped it to 1/2 tablet 4 times per day) -change nighttime carbidopa/levodopa CR to klonopin 0.5 mg - 1/2 po q hs  -hold neupro as pt taking q o day and doesn't work  that way.     2. RLS -adding klonopin.  Risks, benefits, side effects and alternative therapies were discussed.  The opportunity to ask questions was given and they were answered to the best of my ability.  The patient expressed understanding and willingness to follow the outlined treatment protocols.  Discussed the [place] laws.  Discussed that he is on an opioid and discussed risk of potentiation.  He will call Dr. Luan Pulling (pain management) to disclose that I gave him klonopin.     3.Low back pain -following with Dr. Rolanda Jay.  Holding on surgery.  Has had previous multiple surgeries  4.  Vision change             -Following with ophthalmology.  Was treated for parasites given the time he spent in the [place] and patient states that they are wondering if parasite/tapeworm are affecting the vision.  I do not have any of the ophthalmology notes.  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  I didn't see any of this today and don't want to do botox on him given the parasite issue around and in the eyes and nasal cavity.    6.  Pt has f/u in feb.  Will keep that appt.  Much greater than 50% of this visit was spent in counseling and coordinating care.  Total face to face time:  25 min  Cc:  Patient, No Pcp Per

## 2018-10-17 NOTE — Telephone Encounter (Signed)
Pt is schedule for 8:15 on 10-19-18

## 2018-10-17 NOTE — Telephone Encounter (Signed)
Patient's wife, Margarita Grizzle, called with concerns she said his cardiologist recommended they reach out to Dr. Carles Collet to review the patient's medications for Parkinson's that may be causing some side effects like dizziness.

## 2018-10-19 ENCOUNTER — Other Ambulatory Visit: Payer: Self-pay

## 2018-10-19 ENCOUNTER — Ambulatory Visit (INDEPENDENT_AMBULATORY_CARE_PROVIDER_SITE_OTHER): Payer: BC Managed Care – PPO | Admitting: Neurology

## 2018-10-19 ENCOUNTER — Encounter: Payer: Self-pay | Admitting: Neurology

## 2018-10-19 VITALS — BP 139/87 | HR 82 | Ht 66.0 in | Wt 197.4 lb

## 2018-10-19 DIAGNOSIS — G20 Parkinson's disease: Secondary | ICD-10-CM | POA: Diagnosis not present

## 2018-10-19 DIAGNOSIS — G2581 Restless legs syndrome: Secondary | ICD-10-CM | POA: Diagnosis not present

## 2018-10-19 DIAGNOSIS — R42 Dizziness and giddiness: Secondary | ICD-10-CM

## 2018-10-19 MED ORDER — CLONAZEPAM 0.5 MG PO TABS: 0.2500 mg | ORAL_TABLET | Freq: Every day | ORAL | 1 refills | Status: DC

## 2018-10-19 NOTE — Patient Instructions (Signed)
1.  Stop carbidopa/levodopa 25/100 IR (yellow pill)  2.  Start carbidopa/levodopa 25/100 CR at 7am/11am/3pm/7pm (this was the pill you were previously taking just at bed) 3.  Stop the neupro patch 4.  Start klonopin - 0.5 mg - 1/2 tablet at bedtime for RLS

## 2018-10-20 ENCOUNTER — Ambulatory Visit: Payer: BLUE CROSS/BLUE SHIELD | Admitting: Neurology

## 2018-10-25 ENCOUNTER — Telehealth: Payer: Self-pay | Admitting: Neurology

## 2018-10-25 NOTE — Telephone Encounter (Signed)
Caller left msg with after hours about a medication change and he is not feeling well from the change. Caller states he feels like his nerves are popping off in his hands and fingers. Not sleeping at all. Stumbling around the house. Dizzy. New med change started Thursday and he hasn't been feeling well since. After hours RN told them to go to the ER.

## 2018-10-25 NOTE — Telephone Encounter (Signed)
Spoke with patient and advised on instructions per Dr. Carles Collet

## 2018-10-25 NOTE — Telephone Encounter (Signed)
Left message for patient to contact office to discuss medication.

## 2018-10-25 NOTE — Telephone Encounter (Signed)
I changed him from the IR to the CR which is generally better tolerated.  I don't know what it means by "nerves popping off."  Have him only take the carbidopa/levodopa 25/100 CR tid instead of qid and see if he feels better.  Have him take his BP as well.

## 2018-10-25 NOTE — Telephone Encounter (Signed)
Patient was returning your call

## 2018-10-25 NOTE — Telephone Encounter (Signed)
fyi

## 2018-11-08 ENCOUNTER — Other Ambulatory Visit: Payer: Self-pay | Admitting: Neurology

## 2018-12-07 ENCOUNTER — Telehealth: Payer: Self-pay | Admitting: Neurology

## 2018-12-07 DIAGNOSIS — G2581 Restless legs syndrome: Secondary | ICD-10-CM

## 2018-12-07 DIAGNOSIS — G20 Parkinson's disease: Secondary | ICD-10-CM

## 2018-12-07 NOTE — Telephone Encounter (Signed)
Referral faxed 12/07/2018

## 2018-12-07 NOTE — Telephone Encounter (Signed)
1.  Agree with ED 2.  Pts back pain is separate from PD and he has chronic pain and should f/u with his back dr 3.  Pt has reported he has felt "horrible" ever since I have met him.  I hate that for him.  I have altered his medications many, many times and we are not getting anywhere successfully.  Therefore, I think it is best that he gets another opinion at [facility] center. He was at [facility] years ago but was unhappy there.  How about [facility] (that may be closer to him)?

## 2018-12-07 NOTE — Telephone Encounter (Signed)
Agreeable to [facility] referral. Still declines ED

## 2018-12-07 NOTE — Telephone Encounter (Signed)
Patient states that he feels "horrible" dizzy headache weakness sob. No temperature but has had a cold sweats. Legs and feet are numb and feeling like they are curling under. Advised to go to ED but patient declined.

## 2018-12-07 NOTE — Telephone Encounter (Signed)
Patient's wife called needing to speak to someone regarding his Parkinson's and his back. She said that his toes are curled up and legs are numb and he is also having dizziness. She said he has no temperature. She said he is just not feeling well. Please Call. Thanks

## 2018-12-07 NOTE — Telephone Encounter (Signed)
Please refer to [facility] for PD.  Remove from my schedule since he will be seen there (once you make sure that he is accepted as a patient there)

## 2018-12-12 ENCOUNTER — Telehealth: Payer: Self-pay | Admitting: Neurology

## 2018-12-12 NOTE — Telephone Encounter (Signed)
Sent my chart message and re faxed referral

## 2018-12-12 NOTE — Telephone Encounter (Signed)
Wife is calling in about the [facility] referral. She said they haven't heard anything yet and she was wondering if this was for another neurologist or if it was a parkinson's specific DR. Thanks!

## 2019-01-01 ENCOUNTER — Telehealth: Payer: Self-pay | Admitting: Neurology

## 2019-01-01 NOTE — Telephone Encounter (Signed)
Please let her know I do not see any documentation of any time, courtney will be here Wednesday, they can call back then, usually takes up to 2 weeks for a new referral thanks

## 2019-01-01 NOTE — Telephone Encounter (Signed)
Patient's wife, Keane Police, called again to follow up on a referral to [facility] for the patient. She said she is eager to get him scheduled there, per Dr. Doristine Devoid recommendation.   See telephone note from 12/12/2018 for reference.

## 2019-01-02 NOTE — Telephone Encounter (Signed)
Patient's spouse aware Loma Sousa will follow up with [facility] and return her call tomorrow with an update. See MyChart message from 12/12/18 and telephone note from 12/07/18 for reference.  Also, she said [facility] told her they will see the patient if they are called and told the referral is "urgent." [facility]: [phone].   She'd like referrals sent to both places to see where he can get in soonest.

## 2019-01-03 NOTE — Telephone Encounter (Signed)
Faxed referral to [facility] since patient is willing to travel, they are aware of the distance.

## 2019-01-03 NOTE — Telephone Encounter (Signed)
In the chart a referral is shown, three telephone calls discussing the referral, and a mychart message discussing the referral. I have repeatedly reached out to the clinic and per the previous messages told the patient [facility] is not one of the places advised to be referred to by Dr Tat. This can all be referenced in the patients chart. A new mychart message is in the chart today advising the patient of this again.

## 2019-01-08 NOTE — Telephone Encounter (Signed)
Wife is calling in wanting the referral sent to [facility]. She said that they haven't received it yet. She said the number for them is [phone] and to call to see what all they need for the referral and also to mark it urgent so that [facility] can have them worked in. Thanks!

## 2019-01-10 NOTE — Telephone Encounter (Signed)
I have faxed referrals multiple times to the [place] [facility] and the [facility] even though previously they did not want to go to [place] due to the travel and they did not like the care received the first time. I have called the clinics and been given different fax numbers to try as well with no luck. How should I precede with the referrals at this point?

## 2019-01-10 NOTE — Telephone Encounter (Signed)
It will probably take time to get in, and this isn't urgent.  Its just a routine referral.  I just didn't have more to offer him since the treatments we were doing were not helping.  You can call [facility] (which is where I referred - not sure where [facility] came in here) and see if they received the referral.

## 2019-01-10 NOTE — Telephone Encounter (Signed)
Agree.  They are being seen today and we have transferred care to them as I had nothing further to offer unfortunately

## 2019-01-10 NOTE — Telephone Encounter (Signed)
Called and got confirmation for patients appointment on 02/08/2019 at 2pm. Patient stated that he is canceling the [facility] appointment. Appointment today with [facility]. States he has never seen them before and is unsure why that is stated. He would also like refills on medications, and I did advise that we should wait until after his appointment today.

## 2019-01-12 ENCOUNTER — Encounter: Payer: Self-pay | Admitting: Neurology

## 2019-01-15 ENCOUNTER — Other Ambulatory Visit: Payer: Self-pay | Admitting: Neurology

## 2019-02-12 ENCOUNTER — Ambulatory Visit: Payer: Medicare Other | Admitting: Neurology

## 2019-03-07 ENCOUNTER — Encounter: Payer: Self-pay | Admitting: Neurology

## 2019-03-07 ENCOUNTER — Other Ambulatory Visit: Payer: Self-pay | Admitting: Neurology

## 2019-03-07 NOTE — Telephone Encounter (Signed)
Rx(s) sent to pharmacy electronically.  

## 2019-03-08 ENCOUNTER — Other Ambulatory Visit: Payer: Self-pay

## 2019-03-08 ENCOUNTER — Telehealth (INDEPENDENT_AMBULATORY_CARE_PROVIDER_SITE_OTHER): Payer: BC Managed Care – PPO | Admitting: Neurology

## 2019-03-08 VITALS — Ht 66.0 in | Wt 195.0 lb

## 2019-03-08 DIAGNOSIS — M545 Low back pain, unspecified: Secondary | ICD-10-CM

## 2019-03-08 DIAGNOSIS — G20 Parkinson's disease: Secondary | ICD-10-CM

## 2019-03-08 DIAGNOSIS — G2581 Restless legs syndrome: Secondary | ICD-10-CM

## 2019-03-08 DIAGNOSIS — R42 Dizziness and giddiness: Secondary | ICD-10-CM

## 2019-03-08 MED ORDER — CARBIDOPA-LEVODOPA ER 25-100 MG PO TBCR: 1.0000 | EXTENDED_RELEASE_TABLET | Freq: Every day | ORAL | 1 refills | Status: DC

## 2019-03-08 MED ORDER — CARBIDOPA-LEVODOPA 25-100 MG PO TABS: ORAL_TABLET | ORAL | 1 refills | Status: DC

## 2019-03-08 NOTE — Progress Notes (Signed)
Virtual Visit via Video Note The purpose of this virtual visit is to provide medical care while limiting exposure to the novel coronavirus.    Consent was obtained for video visit:  Yes.   Answered questions that patient had about telehealth interaction:  Yes.   I discussed the limitations, risks, security and privacy concerns of performing an evaluation and management service by telemedicine. I also discussed with the patient that there may be a patient responsible charge related to this service. The patient expressed understanding and agreed to proceed.  Pt location: Home Physician Location: office Name of referring provider:  No ref. provider found I connected with Brett Patterson at patients initiation/request on 03/08/2019 at 10:45 AM EST by video enabled telemedicine application and verified that I am speaking with the correct person using two identifiers. Pt MRN:  [id number] Pt DOB:  1950-03-12 Video Participants:  Brett Patterson;     History of Present Illness:  Patient seen today in follow-up.  I last saw him in October.  At that point in time, I changed him from the immediate release tablet to the extended release.  I also stopped his neupro patch because of dizziness (and he was only wearing it every other day).  Part of the reason I changed him to the CR was because the patient was cutting down on medication on his own as he thought it caused dizziness, shortness of breath and fatigue, and he was very underdosed (he was only taking carbidopa/levodopa 25/100 1/2 tab qid).  I also had added clonazepam at bedtime for restless leg.  He called me about a week later stating that he did not feel well and was more dizzy.  He then called me in early December stating that he was having back pain, headache and more dizziness.  Ultimately, I recommended that he go back to [facility] (had been there previously) or get a consultation at [facility] (had already seen neurology at [facility] neurology) to see if  they had further recommendations.  I had already changed his medications multiple times and patient had continued to have the same complaints.  On January 6, he went to [facility].  He was told to increase his levodopa (which we had already done in the past).  He was told that they did not think that dizziness and balance change were related to Parkinson's disease.  They thought his neuropathy and back pain were primary issues in that regard.  He had an appointment with [facility] on February 3, but called them and stated he was not feeling well and had to cancel.  He subsequently was worked in here yesterday for today's appointment.  It is noted he has an appt at [facility] now on 6/21.     Today, the patient states that the dizziness is "overwhelming" and "I feel like i'm drugged."  Having SOB - "I can't walk a flight of stairs without being SOB."  Does have a cardiologist and states that he needs to make a f/u appt.  Also c/o increasing tremor on the R with pain.   Also c/o eye issues - was seeing ophthalmology as was treated for parasites given the time he spent in the [place].  Last time he went, he was told that there were no parasites but there were fragments of debris.  He was supposed to be referred to [facility] ophthalmology but has not heard about that appt yet.  He feels slow and legs feel heavy.  Having back pain and  paresthesias in the feet and legs.  Had new mri lumbar spine done and had that sent to Dr. Maryjean Ka.  He is supposed to confer with Dr. 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  He asks me about a dural leak (I don't have his current films and am unaware of this)  Current movement d/o meds:  Carbidopa/levodopa 25/100 IR,1 tablets at 7 AM/11 AM/3 PM/7 PM Carbidopa/levodopa 25/100 CR at bedtime Neupro patch, 4 mg daily Klonopin 0.5 mg - 1/2 tablet each time (takes it 3-4 times per week)  PREVIOUS MEDICATIONS:  carbidopa/levodopa 25/100; carbidopa/levodopa 25/100 CR q hs (pt  stopped it trying to simplify regimen); ambien;  Requip; Neupro (stopped to see if it would help dizziness)   Current Outpatient Medications on File Prior to Visit  Medication Sig Dispense Refill  . aspirin EC 81 MG tablet Take 81 mg by mouth daily.    Marland Kitchen atorvastatin (LIPITOR) 40 MG tablet Take 40 mg by mouth daily at 6 PM.     . betamethasone dipropionate 0.05 % lotion APPLY TO SCALP DAILY AS NEEDED FOR ITCHING    . carbidopa-levodopa (SINEMET IR) 25-100 MG tablet TAKE 1 TABLET BY MOUTH FOUR TIMES A DAY 120 tablet 0  . Carbidopa-Levodopa ER (SINEMET CR) 25-100 MG tablet controlled release TAKE 1 TABLET BY MOUTH EVERYDAY AT BEDTIME 90 tablet 1  . cholecalciferol (VITAMIN D) 1000 units tablet Take 1,000 Units by mouth daily.    . clonazePAM (KLONOPIN) 0.5 MG tablet Take 0.5 tablets (0.25 mg total) by mouth at bedtime. 45 tablet 1  . clopidogrel (PLAVIX) 75 MG tablet Take by mouth.    . Coenzyme Q10 (COQ10) 100 MG CAPS Take 1 capsule by mouth daily.     . folic acid (FOLVITE) 1 MG tablet Take 1 mg by mouth daily.    Marland Kitchen ketoconazole (NIZORAL) 2 % shampoo WASH HAIR AND AROUND NOSE 3 TIMES A WEEK  APPLY TO SCALP LATHER, LET SET FOR 10 MINS, THEN RINSE    . Multiple Vitamins-Minerals (ZINC PO) Take 1 tablet by mouth daily.     Marland Kitchen NEUPRO 4 MG/24HR APPLY 1 PATCH ONTO THE SKIN EVERY DAY 90 patch 1  . nitroGLYCERIN (NITROSTAT) 0.4 MG SL tablet PLACE ONE TABLET SUBLINGUALLY EVERY 5 MINTUES FOR CHEST PAIN, MAX 3 TABS  3  . oxyCODONE-acetaminophen (PERCOCET/ROXICET) 5-325 MG tablet Take 1 tablet by mouth in the morning, at noon, and at bedtime.     . vitamin B-12 (CYANOCOBALAMIN) 1000 MCG tablet Take 1,000 mcg by mouth daily.     No current facility-administered medications on file prior to visit.     Observations/Objective:   Vitals:   03/07/19 1538  Weight: 195 lb (88.5 kg)  Height: 5\' 6"  (1.676 m)   GEN:  The patient appears stated age and is in NAD.  Neurological examination:  Orientation:  The patient is alert and oriented x3. Cranial nerves: There is good facial symmetry. There is facial hypomimia.  The speech is fluent and clear. Soft palate rises symmetrically and there is no tongue deviation. Hearing is intact to conversational tone. Motor: Strength is at least antigravity x 4.   Shoulder shrug is equal and symmetric.  There is no pronator drift.  Movement examination: Tone: unable Abnormal movements: Right upper extremity rest tremor can be seen Coordination:  There is  decremation with RAM's and slowness with all rapid alternating movements in the upper extremities.  Could not see the lower  extremities to examine Gait and Station: The patient is in a very small area and the video did not capture him ambulating well.    Assessment and Plan:   1.  Parkinson's disease  -Have tried multiple different medications with the patient, including immediate release, extended release, taking him off of the rotigotine.  He has seen several different neurologists, including [facility] neurology and [place].  I talked with the patient today that I do not think that the dizziness is coming from the Parkinson's disease, even though Parkinson's can cause dizziness.  We have tried multiple things to get rid of that dizziness, including taking him off of medication and completely changing his regimen around, and it did not change the dizziness.  [place] neurology agreed that they did not think that the dizziness was from the Parkinson's disease.  It would be my recommendation that he get this worked up from primary care and/or cardiology.  He asks me if I thought that a dural leak could cause dizziness.  It certainly could, but I do not have his MRI films to assess whether or not he had that.  He is seeing neurosurgery.  I told him to follow-up with that.  I will send neurosurgery note in that regard as well.  -I do think he is likely underdosed.  We will increase his carbidopa/levodopa 25/100, so that he is  taking 2 tablets at 7 AM, 1 tablet at 11 AM, 2 tablets at 3 PM, 1 tablet at 7 PM.  (Currently 1 tablet 4 times per day).  I do not think that is the source of his dizziness.  We have proven that in the past.  -Continue carbidopa/levodopa 25/100 CR at bedtime.  -Continue rotigotine, 4 mg.  We have taken him off of this in the past, and that was not the source of the dizziness.  2.  Restless leg  -Patient uses clonazepam 0.5 mg, half tablet at night as needed.  3.  Low back pain  -This certainly has been one of the disabling features.  He has had multiple surgeries.  This is unrelated to Parkinson's disease.  This has prevented him from exercising, which has affected the Parkinson's disease.  He is following with Dr. Maryjean Ka and Dr. Vertell Limber.  Follow Up Instructions:    -I discussed the assessment and treatment plan with the patient. The patient was provided an opportunity to ask questions and all were answered. The patient agreed with the plan and demonstrated an understanding of the instructions.   The patient was advised to call back or seek an in-person evaluation if the symptoms worsen or if the condition fails to improve as anticipated.    Total time spent on today's visit was 40 minutes, including both face-to-face time and nonface-to-face time.  Time included that spent on review of records (prior notes available to me/labs/imaging if pertinent), discussing treatment and goals, answering patient's questions and coordinating care.   Alonza Bogus, DO

## 2019-05-11 ENCOUNTER — Telehealth: Payer: Self-pay | Admitting: Neurology

## 2019-05-11 NOTE — Telephone Encounter (Signed)
If he doesn't need it for RLS, it would be good to get off of it, esp since he is on long term opioids and they potentiate the effect of one another.  If he does need it for RLS, it may be best that it is RX by pain management physician (the [place] STOP Act looks at how opioids are RX and in patients on meds like klonopin, its best done by same physician ideally)

## 2019-05-11 NOTE — Telephone Encounter (Signed)
Patient called in wanting advise on if Dr. Carles Collet would like for him to continue taking the [place]. He needs a refill, but did not want to order it if he shouldn't continue the medication.

## 2019-05-11 NOTE — Telephone Encounter (Signed)
Patient needs a refill on Clonazepam.

## 2019-05-14 NOTE — Telephone Encounter (Signed)
Patient notified of Dr Doristine Devoid recommendations and voiced understanding. (advised patient that if he needs a refill on Klonopin to contact pain management doctor. Patient stated he is not taking the medication and will contact his pain doctor if he needs it.)  Follow up appt date given and time given. Patient voiced understanding.

## 2019-05-18 ENCOUNTER — Ambulatory Visit: Payer: BC Managed Care – PPO | Admitting: Gastroenterology

## 2019-08-20 NOTE — Progress Notes (Signed)
Assessment/Plan:   1.  Parkinsons Disease  -We have tried multiple different medications with the patient because of dizziness, including immediate release, extended release, trialing him off of the rotigotine.  None of these options have made a significant change in his dizziness and we have discussed that I do not think, therefore, that the dizziness is related to Parkinson's.  He has sought 2nd opinions about this and [place] neurology agreed that they did not think that the dizziness was from Parkinson's disease.  -increase rotigotine, 6 mg daily (we have tried to discontinue this in the past and dizziness persisted)  -he is very underdosed and have tried several times to increase his carbidopa/levodopa.  He is supposed to be on carbidopa/levodopa 25/100, 2 tablets at 7 AM/1 tablet at 11 AM/2 tablets at 3 PM/1 tablet at 7 PM but decreased it as did not think helped.  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  I didn't change way he dosed it today as changing it to CR just to make sure it wouldn't change dizzy/fatigue  -take carbidopa/levodopa 25/100 CR, 1 po 5 x per day  -discussed in detail DBS including risks and benefits of it as well as levodopa challenge, neurocog testing.  Wants to hold for now  2.  Restless leg  -discussed with pt/daughter that can be associated with Parkinsons Disease  -took self off of low dose klonopin.  However, if wants to go back on would prefer that pain management prescribe due to [place] STOP act.  3.  Dizziness  -As above, chronic, but disabling the patient.  Do not think that it is related to Parkinson's disease, and he had a 2nd opinion at [facility] and they did not think so either.  We have tried to reworked his medications and this did not significantly help.  -pt asked if he could have chronic lyme.  Discussed that while lyme disease is out of my field of expertise, chronic lyme is not felt to be something that generally  exists  -pt does think that could be cardiac in nature.  Having associated CP and nitro helps it.  Discussed again that needs to get back to cardiology and discuss with cardiologist and PCP  4.  Chronic low back pain  -This is another disabling feature for the patient.  He has had multiple back surgeries.  This has prevented him from exercising, which also has affected the Parkinson's disease.  He has followed with Dr. Maryjean Ka in the past as well as Dr. Vertell Limber.  5.  Follow up is anticipated in the next 6 months, sooner should new neurologic issues arise.  Subjective:   Brett Patterson was seen today in follow up for Parkinsons disease.  My previous records were reviewed prior to todays visit as well as outside records available to me.   This patient is accompanied in the office by his daughter who supplements the history.Pt denies falls.    No hallucinations.  Last visit, the patient had again talked about significant dizziness.We have tried multiple different medications with the patient because of dizziness, including immediate release, extended release, trialing him off of the rotigotine.  None of these options have made a significant change in his dizziness and we have discussed that I do not think, therefore, that the dizziness is related to Parkinson's.  He has sought 2nd opinions about this and [place] neurology agreed that they did not think that the dizziness was from Parkinson's  disease.  He was going to seek an opinion with neurosurgery to see if a dural leak was the issue.  We encouraged him to follow-up with cardiology and primary care as well.  He has not talked to them about that yet.  Medical records reviewed since last visit.  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  He notes more tremor that increases with his back pain.  He has decreased his levodopa, as he didn't think that the  increases was helpful.  He is still seeing Dr. Vertell Limber for the back pain and taking percocet for the back pain - 1 in the AM and 1-2 q hs.  He is having sleep trouble and vivid dreams/acting out of the dreams.  He is not taking the klonopin - wasn't sure if it helped for the dreams.  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 Codeine Other (See Comments), Anxiety and Itching    Other Reaction: Other reaction     CURRENT MEDICATIONS:  Outpatient Encounter Medications as of 08/22/2019  Medication Sig  . Ascorbic Acid (VITAMIN C) 1000 MG tablet Take 1,000 mg by mouth in the morning and at bedtime.  Marland Kitchen aspirin EC 81 MG tablet Take 81 mg by mouth daily.  Marland Kitchen atorvastatin (LIPITOR) 40 MG tablet Take 40 mg by mouth daily at 6 PM.   . betamethasone dipropionate 0.05 % lotion APPLY TO SCALP DAILY AS NEEDED FOR ITCHING  . cholecalciferol (VITAMIN D) 1000 units tablet Take 1,000 Units by mouth daily.  . clopidogrel (PLAVIX) 75 MG tablet Take 75 mg by mouth daily.   . Coenzyme Q10 (COQ10) 100 MG CAPS Take 1 capsule by mouth daily.   . folic acid (FOLVITE) 1 MG tablet Take 1 mg by mouth daily.  Marland Kitchen ketoconazole (NIZORAL) 2 % shampoo WASH HAIR AND AROUND NOSE 3 TIMES A WEEK  APPLY TO SCALP LATHER, LET SET FOR 10 MINS, THEN RINSE  . Multiple Vitamins-Minerals (ZINC PO) Take 1 tablet by mouth daily.   .  nitroGLYCERIN (NITROSTAT) 0.4 MG SL tablet PLACE ONE TABLET SUBLINGUALLY EVERY 5 MINTUES FOR CHEST PAIN, MAX 3 TABS  . oxyCODONE-acetaminophen (PERCOCET/ROXICET) 5-325 MG tablet Take 1 tablet by mouth in the morning, at noon, and at bedtime.   . vitamin B-12 (CYANOCOBALAMIN) 1000 MCG tablet Take 1,000 mcg by mouth daily.  . [DISCONTINUED] carbidopa-levodopa (SINEMET IR) 25-100 MG tablet 2 at 7am/1 at 11am/2 at 3pm/1 at 7pm (Patient taking differently: Take 1 tablet by mouth 4 (four) times daily. )  . [DISCONTINUED] Carbidopa-Levodopa ER (SINEMET CR) 25-100 MG tablet controlled release Take 1 tablet by mouth at bedtime.  . [DISCONTINUED] NEUPRO 4 MG/24HR APPLY 1 PATCH ONTO THE SKIN EVERY DAY  . Carbidopa-Levodopa ER (SINEMET CR) 25-100 MG tablet controlled release Take 1 tablet by mouth  5 (five) times daily.  . clonazePAM (KLONOPIN) 0.5 MG tablet Take 0.5 tablets (0.25 mg total) by mouth at bedtime. (Patient not taking: Reported on 08/22/2019)  . rotigotine (NEUPRO) 6 MG/24HR Place 1 patch onto the skin daily.   No facility-administered encounter medications on file as of 08/22/2019.    Objective:   PHYSICAL EXAMINATION:    VITALS:   Vitals:   08/22/19 0854  BP: (!) 153/90  Pulse: 87  SpO2: 97%  Weight: 202 lb (91.6 kg)  Height: 5\' 7"  (1.702 m)    GEN:  The patient appears stated age and is in NAD. HEENT:  Normocephalic, atraumatic.  The mucous membranes are moist. The superficial temporal arteries are without ropiness or tenderness. CV:  RRR Lungs:  CTAB Neck/HEME:  There are no carotid bruits bilaterally.  Neurological examination:  Orientation: The patient is alert and oriented x3. Cranial nerves: There is good facial symmetry with facial hypomimia. The speech is fluent and clear. Soft palate rises symmetrically and there is no tongue deviation. Hearing is intact to conversational tone. Sensation: Sensation is intact to light touch throughout Motor: Strength is at least  antigravity x4.  Movement examination: Tone: There is mod to severe increased tone in the LUE and mild to mod in the RUE Abnormal movements: there is L>RUE rest tremor Coordination:  There is  decremation with RAM's, with any form of RAMS, including alternating supination and pronation of the forearm, hand opening and closing, finger taps, heel taps and toe taps. Gait and Station: The patient has mild difficulty arising out of a deep-seated chair without the use of the hands. The patient's stride length is decreased with slowness in the turns.    I have reviewed and interpreted the following labs independently    Chemistry      Component Value Date/Time   NA 141 07/11/2018 1655   K 3.9 07/11/2018 1655   CL 104 07/11/2018 1655   CO2 26 07/11/2018 1655   BUN 18 07/11/2018 1655   CREATININE 0.95 07/11/2018 1655      Component Value Date/Time   CALCIUM 9.7 07/11/2018 1655       Lab Results  Component Value Date   WBC 7.7 07/11/2018   HGB 15.4 07/11/2018   HCT 47.6 07/11/2018   MCV 92.8 07/11/2018   PLT 243 07/11/2018    No results found for: TSH   Total time spent on today's visit was 75 minutes, including both face-to-face time and nonface-to-face time.  Time included that spent on review of records (prior notes available to me/labs/imaging if pertinent), discussing treatment and goals, answering patient's questions and coordinating care.  Cc:  Quentin Cornwall, MD

## 2019-08-22 ENCOUNTER — Ambulatory Visit (INDEPENDENT_AMBULATORY_CARE_PROVIDER_SITE_OTHER): Payer: BC Managed Care – PPO | Admitting: Neurology

## 2019-08-22 ENCOUNTER — Encounter: Payer: Self-pay | Admitting: Neurology

## 2019-08-22 ENCOUNTER — Other Ambulatory Visit: Payer: Self-pay

## 2019-08-22 VITALS — BP 153/90 | HR 87 | Ht 67.0 in | Wt 202.0 lb

## 2019-08-22 DIAGNOSIS — R42 Dizziness and giddiness: Secondary | ICD-10-CM

## 2019-08-22 DIAGNOSIS — G20 Parkinson's disease: Secondary | ICD-10-CM

## 2019-08-22 DIAGNOSIS — M545 Low back pain, unspecified: Secondary | ICD-10-CM

## 2019-08-22 MED ORDER — CARBIDOPA-LEVODOPA ER 25-100 MG PO TBCR: 1.0000 | EXTENDED_RELEASE_TABLET | Freq: Every day | ORAL | 1 refills | Status: DC

## 2019-08-22 MED ORDER — NEUPRO 6 MG/24HR TD PT24: 1.0000 | MEDICATED_PATCH | Freq: Every day | TRANSDERMAL | 1 refills | Status: DC

## 2019-08-22 NOTE — Patient Instructions (Addendum)
1.  Make an appt with Dr. Vertell Limber to ask about your questions about dural leak 2.  Make an appointment with cardiology regarding chest pain and near passing out episodes 3.  Take carbidopa/levodopa 25/100 CR, 1 tablet 5 times per day 4.  Increase neupro, 6 mg, night  The physicians and staff at [facility] are committed to providing excellent care. You may receive a survey requesting feedback about your experience at our office. We strive to receive "very good" responses to the survey questions. If you feel that your experience would prevent you from giving the office a "very good " response, please contact our office to try to remedy the situation. We may be reached at [phone]. Thank you for taking the time out of your busy day to complete the survey.

## 2019-10-23 ENCOUNTER — Telehealth: Payer: Self-pay | Admitting: Neurology

## 2019-10-23 NOTE — Telephone Encounter (Signed)
Left message for patient to contact office.

## 2019-10-23 NOTE — Telephone Encounter (Signed)
Stem cells for Parkinsons Disease was done in trials years ago and failed.  He and I have discussed DBS.  He has gotten other opinions at [facility].  He is underdosed but doesn't feel that he can take more meds unfortunately.  I'm not sure what else we can do.  He lives in [place] and certainly can go to [facility] if he would like and see what options they have.

## 2019-10-23 NOTE — Telephone Encounter (Signed)
Patient called and said, "My Parkinson's has gotten really bad. I feel like I'm out of options so I'm looking for new ones. Does Dr. Carles Collet do any work with stem cells for Parkinson's?"

## 2019-10-24 NOTE — Telephone Encounter (Signed)
Left message for patient to contact office.

## 2019-10-24 NOTE — Telephone Encounter (Signed)
Patient left a message after hours returning a call to [place].

## 2019-10-31 ENCOUNTER — Telehealth: Payer: Self-pay | Admitting: Neurology

## 2019-10-31 NOTE — Telephone Encounter (Signed)
Spoke with patient who states he would like some information about stem cell treatment. He states he and Dr Tat discussed the DBS briefly. He states the DBS only helps with tremor. He states he is looking for a treatment that helps with everything slow motion, weakness, tremor and freezing, something that treats all his issues. He wants to know if you have any recommendations or suggestions for stem cell therapy kind of therapy. Patient states he feels absolutely horrible. He can not get any rest or sleep. He states the restless leg is bad his legs twitch and go numb. He states both feet go numb up to the calfs. The tremors are not going away. He states nothing seems to work.   Patient states he needs some help and his current treatment plan is not going good. He states he would like to try the stem cell. He has been reading about it and even though its not FDA approved it has good reviews. He states something has to be done and soon.   Patient states he found a place called [facility] and they are based out of [place] and he wanted to know if Dr Tat has heard of them. They have good reviews. He states if you have know of a place closer he would be ok with that.   He states he would like more information on the DBS as well as stem cell therapy.

## 2019-11-01 NOTE — Telephone Encounter (Signed)
I addressed this in 10/19 phone call.  See that

## 2019-11-01 NOTE — Telephone Encounter (Signed)
Gave patient Dr Arturo Morton' recommendations (stem cell therapy not effective for parkinson's disease, on low dose of medication, should consider DBS) patient voiced understanding and stated he has some important desicions to make and that he will be in touch with our office at a later date.

## 2019-11-02 IMAGING — CT CT MAXILLOFACIAL WITHOUT CONTRAST
3 of 6 series · 14 of 47 positions shown, 17 images · non-contrast
Comparison: None.

CLINICAL DATA: Chronic sinusitis

EXAM:
CT MAXILLOFACIAL WITHOUT CONTRAST
TECHNIQUE: Multidetector CT images of the paranasal sinuses were obtained using
the standard protocol without intravenous contrast.

[Series 2: maxillofacial 2.00 hr60 s3 axial · axial · 0.34mm/px · z∈[-508,-406]mm · 10 of 61 slices shown, 13 images]
[im 5/61  brain]
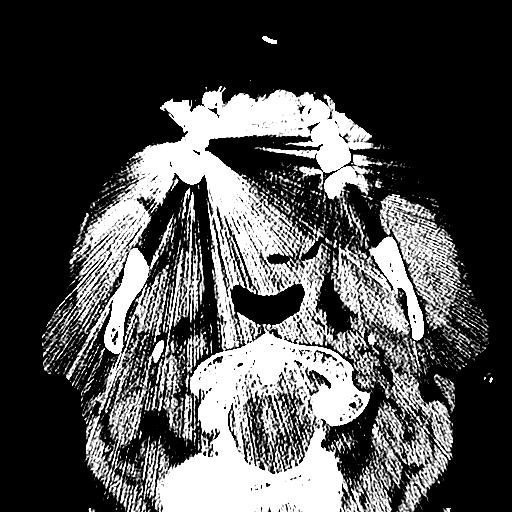
[im 5/61  bone]
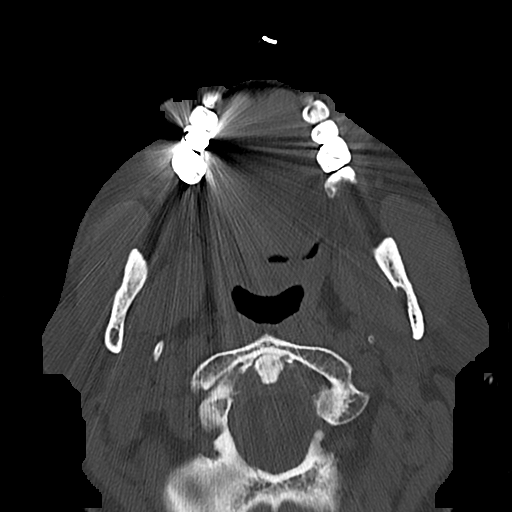
[im 9/61  bone]
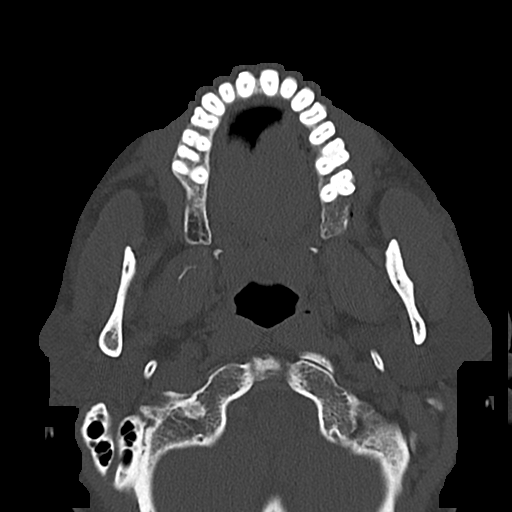
[im 18/61  bone]
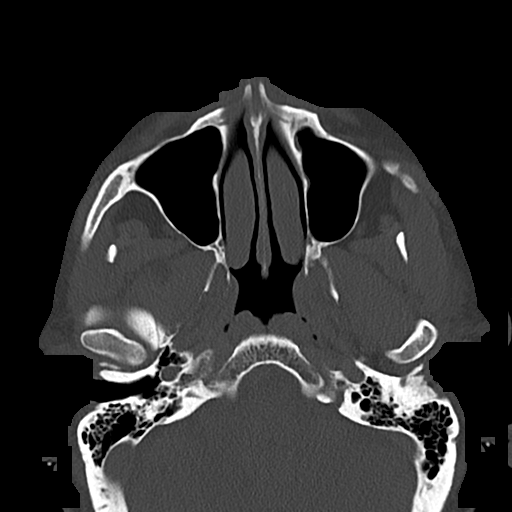
[im 22/61  bone]
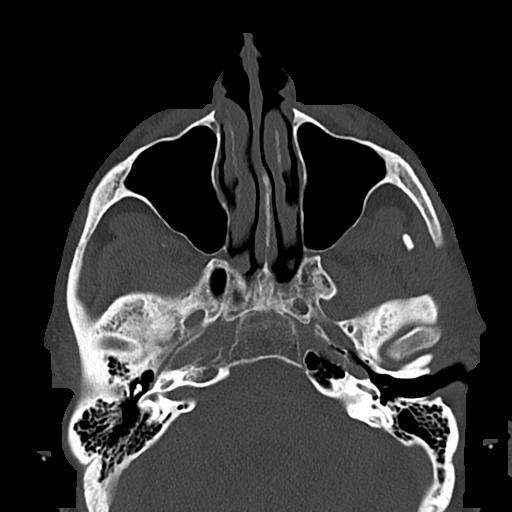
[im 26/61  brain]
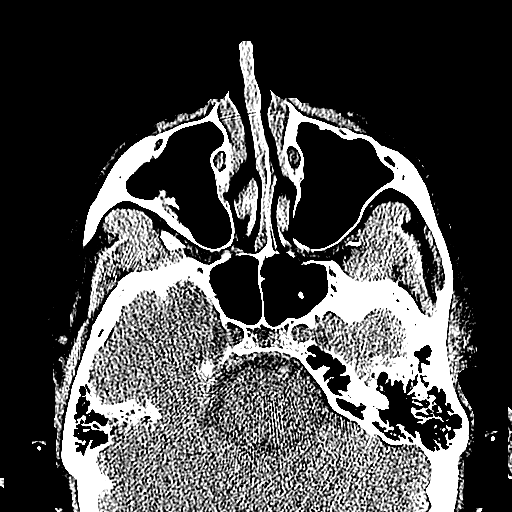
[im 26/61  bone]
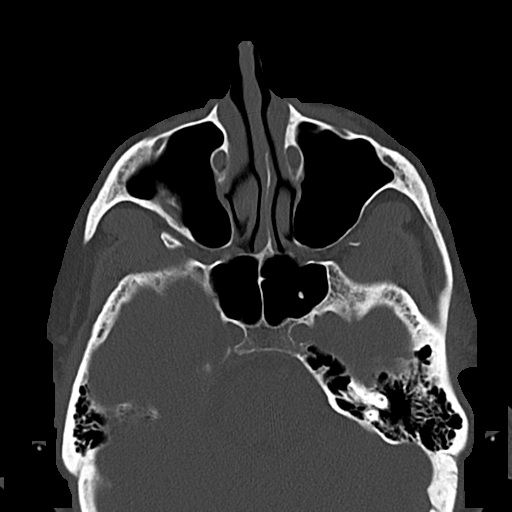
[im 35/61  bone]
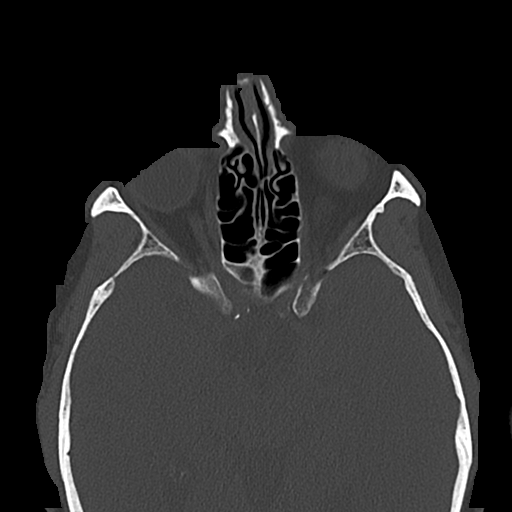
[im 39/61  bone]
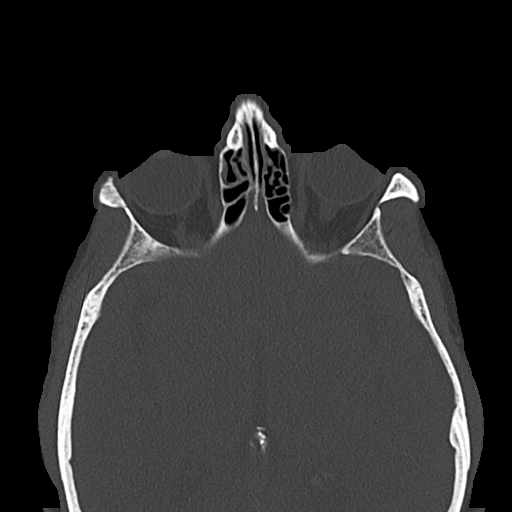
[im 43/61  bone]
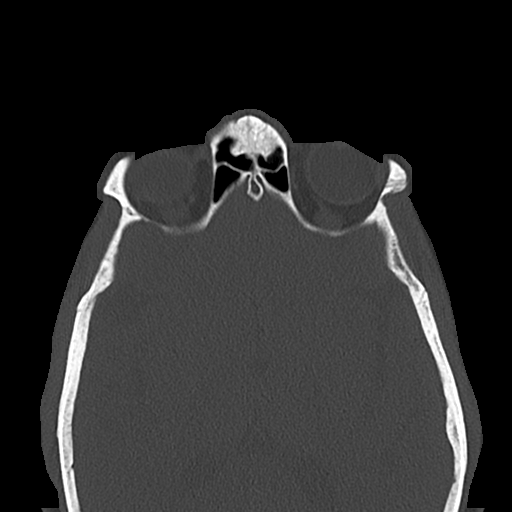
[im 52/61  brain]
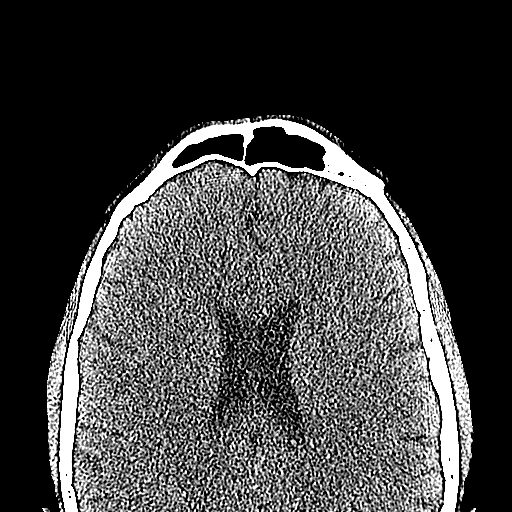
[im 52/61  bone]
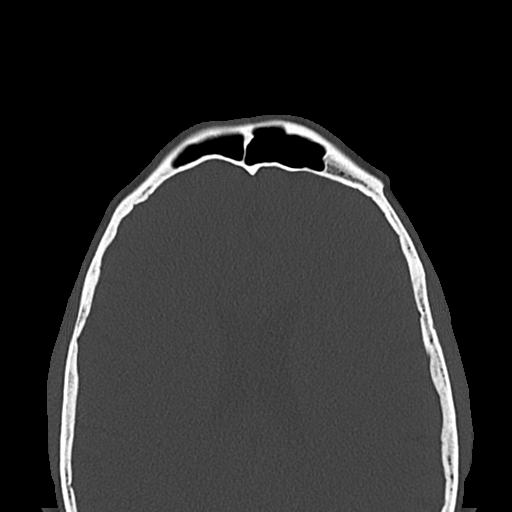
[im 56/61  bone]
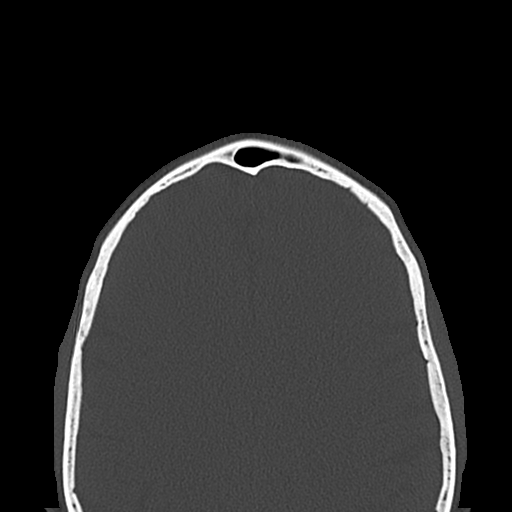

[Series 6: maxillofacial 2.00 hr60 s3 cor · coronal · 0.24mm/px · 3 of 87 slices shown]
[im 22/87  bone]
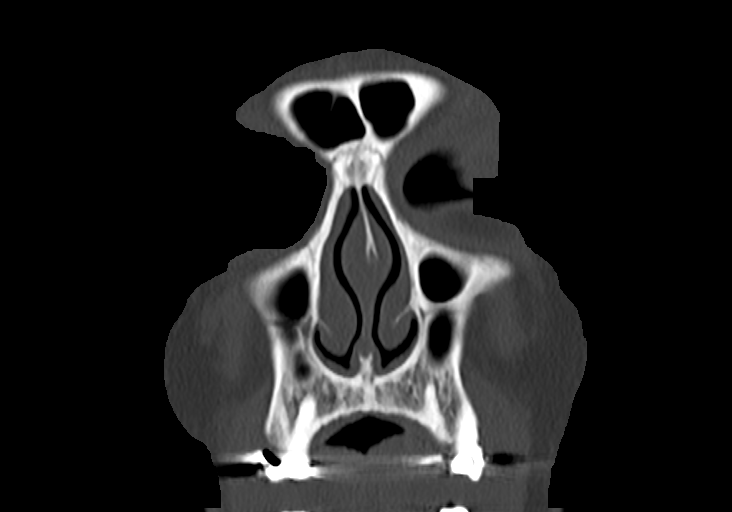
[im 44/87  bone]
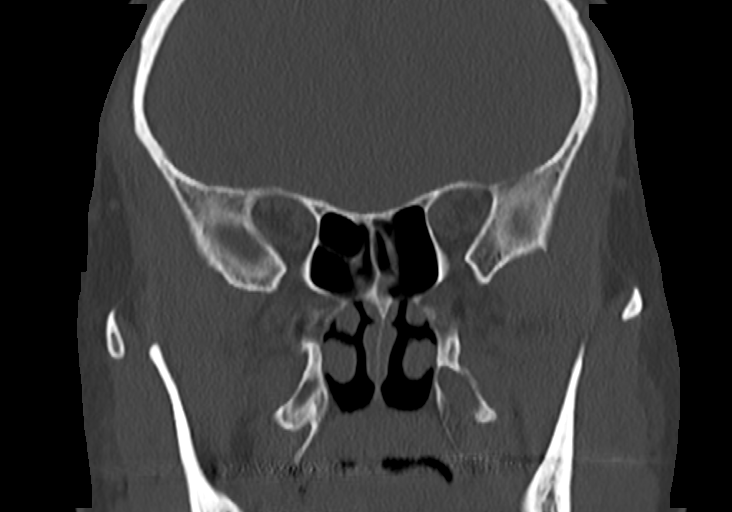
[im 65/87  bone]
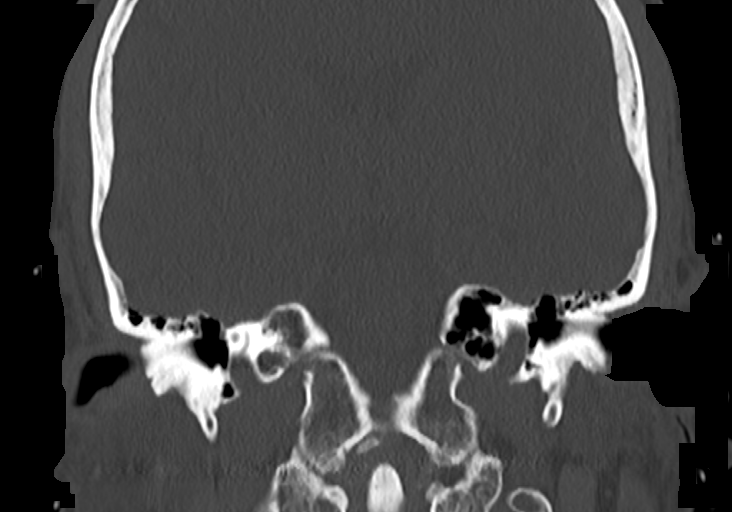

[Series 10: maxillofacial 2.00 hr60 s3 sag · sagittal · 0.24mm/px · 1 of 87 slices shown]
[im 44/87  bone]
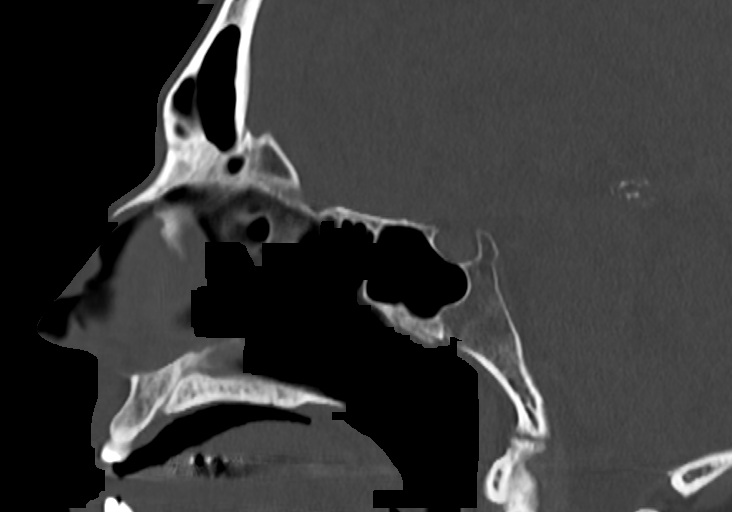

[14 of 47 positions shown; findings below may reference images not displayed]

FINDINGS: Paranasal sinuses:

Frontal: Normally aerated. Patent frontal sinus drainage pathways.

Ethmoid: Normally aerated.

Maxillary: Normally aerated.

Sphenoid: Normally aerated. Patent sphenoethmoidal recesses.

Right ostiomeatal unit: Patent.

Left ostiomeatal unit: Patent.

Nasal passages: Patent. Leftward nasal septal deviation without
turbinate contact

Anatomy: No pneumatization superior to anterior ethmoid notches.
Remote blowout fracture of the medial right orbital floor with small
volume fat herniation. There appears to be high-density surgical
implant. Relative rounding of the right inferior rectus without
muscle herniation.

Other: Orbits and intracranial compartment are unremarkable. Visible
mastoid air cells are normally aerated.
IMPRESSION: 1.  No significant active sinusitis or sinus outflow obstruction.
2. Mild leftward septal deviation.
3. Remote blowout fracture of the right orbital floor with small
volume residual fat herniation.

## 2019-11-08 ENCOUNTER — Telehealth: Payer: Self-pay | Admitting: Neurology

## 2019-11-08 NOTE — Telephone Encounter (Addendum)
Spoke with patient and informed him that Dr Tat does not have anything else to offer him. Advised him that Dr Tat feels like the patient is under dosed and needs a high dose of medication. Asked patient if he would like a second opinion.  Patient wants to know is Dr Tat can give him something to help him sleep. Patient states he gets quivers all over. His left arm is sore and tightening up. He states his feet hurt real bad. Patient states he is taking the carbidopa levodopa but its not doing any good and the more he takes it the worse he gets. Patient states he is very dizzy and losing his balance. He states there is pressure behind his eyes. He states he feels horrible.   Patient states he is did have an second opinion at [facility] and they told him the same thing continue the medication and watch for symptoms.   Wife states increasing the meds is not the answer. She states she her husband does not sleep. She states" for Dr Tat to give him something to knock him out at night to help him sleep."    Asked has he reached out to PCP but they refer patient to our office or to cardiology. And they are getting no where and no one is doing anything but charging for office visits. But in the mean time the patient is getting worse.   Informed wife that I will speak with Dr tat and if she has any other suggestions I will give them a call back. She voiced understanding and stated Dr Tat can call them directly.

## 2019-11-08 NOTE — Telephone Encounter (Signed)
Patient called and said, "My medicine is not working at all. I'm dizzy and the electricity in my feet is so bad, I can't sleep. I'm bumping into things and it feels like I'm going pass out. I need to speak with a nurse."  [facility] [address], [place]

## 2019-11-09 NOTE — Telephone Encounter (Signed)
Gave patient all of Dr Doristine Devoid recommendations.   Spoke with patient who states it sad that Dr Tat does not have anything else to offer. He states he put his faith in Dr Tat and she let him down. His wife wants to know if there are any other patches he can try. I stated Dr Tat does not have anything else to offer because the patient does not want to take more carbidopda levodopa.   He states according to his research him not being able to sleep is related to his parkinson's disease.  He states does see a pain doctor, but ok.   He states he is disappointed and does not want to beat a dead horse.

## 2019-11-09 NOTE — Telephone Encounter (Signed)
We have discussed before and he was also told by [facility] neuro that dizzy unrelated to Parkinsons Disease/meds as evidenced by we have changed ALL his meds and he remained dizzy Increasing the meds IS actually the answer as we have discussed before but he is unable to tolerate and reduces the dosage when I attempt that.  Unfortunately, I have no more to offer, other than surgeries as we have discussed.  Happy to get him an opinion at [facility] but I don't have anything else to offer him. They will need to discuss sleep with PCP Pain:  patient has seen pain management.  I believe he has also been to [facility] neurology for this.  He has seen Dr. Vertell Limber and Maryjean Ka as well.  This is not something I treat for him

## 2019-12-04 ENCOUNTER — Other Ambulatory Visit: Payer: Self-pay | Admitting: Neurology

## 2020-01-27 ENCOUNTER — Other Ambulatory Visit: Payer: Self-pay | Admitting: Neurology

## 2020-01-29 MED ORDER — NEUPRO 6 MG/24HR TD PT24: 1.0000 | MEDICATED_PATCH | Freq: Every day | TRANSDERMAL | 0 refills | Status: DC

## 2020-01-29 NOTE — Addendum Note (Signed)
Addended by: Ulice Brilliant T on: 01/29/2020 07:49 AM   Modules accepted: Orders

## 2020-01-29 NOTE — Telephone Encounter (Signed)
Reviewed chart and called medication into pharmacy.

## 2020-02-20 NOTE — Progress Notes (Signed)
Assessment/Plan:   1.  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  None of these options have made a significant change in his dizziness and we have discussed that I do not think, therefore, that the dizziness is related to Parkinson's.  He has sought 2nd opinions about this and [facility] neurology agreed that they did not think that the dizziness was from Parkinson's disease.  -increase rotigotine, 6 mg daily (we have tried to discontinue this in the past and dizziness persisted)  -he is very underdosed and have tried several times to increase his carbidopa/levodopa.  Suspect he likely has levodopa resistant tremor but that it could help rigidity at appropriate dosages, but he has not tolerated those or did not feel well on those.  For now, he will continue carbidopa/levodopa 25/100 CR, 1 po 5 x per day  -discussed in detail DBS including risks and benefits of it as well as levodopa challenge, neurocog testing.  We decided to go ahead and schedule a levodopa challenge test just to see how helpful levodopa is.  Daughter asked questions about DBS.  2.  Restless leg  -discussed with pt/daughter that can be associated with Parkinsons Disease  -took self off of low dose klonopin.  He would like to go back on it.  Because he is seeing pain management and has a narcotic contract, he will need to get that from his pain management physician.  We discussed [place] STOP act.  3.  Dizziness  -As above, chronic, but disabling the patient.  Do not think that it is related to Parkinson's disease, and he had a 2nd opinion at [facility] and they did not think so either.  We have tried to reworked his medications and this did not significantly help.   Discussed this again with patient and daughter today.  -Patient will need to follow-up with primary care and/or his cardiologist in this regard  4.  Chronic low back pain  -This is another disabling feature for the patient.  He has had multiple back surgeries.  This has prevented him from exercising, which also has affected the Parkinson's disease.  He has followed with Dr. Maryjean Ka in the past as well as Dr. Vertell Limber.  Given information to online exercise/biking program.    Subjective:   Brett Patterson was seen today in follow up for Parkinsons disease.  My previous records were reviewed prior to todays visit as well as outside records available to me.   Patient is accompanied by his wife who supplements the history.  Patient has called since last visit stating that he does not feel well and asked about stem cell for Parkinson's.  We explained that that is not a treatment for Parkinson's disease.  He called back 2 weeks later and asked about stem cells again.  We again explained that that is not a treatment and reiterated that he is under dosed, but he does not seem to tolerate adequate dosing of levodopa.  He has not wanted to pursue DBS.  Discussed that I really did not have adequate other options.  He has already sought consultation at [facility].  Offered him consultation at [facility] which he declined.  He called back a week after that complaining about the same things again,  including the dizziness.  His dizziness has been worked up extensively by our office and all of his medications have been changed around without success.  He continues to be dizzy.  Again, he was offered other opinions and he expressed frustration at [place], but declined other opinions and declined DBS and declined increasing medication.  Today, patient states that everything "sucks."  His sleep is bad but daughter states that he is sleeping day.  Asks about going back on clonazepam.  States that switching to the CR didn't change one way or  another.  He is on the patch.   Current prescribed movement disorder medications: Carbidopa/levodopa 25/100 CR, 1 tablet 5 times per day (changed to see our last visit to see if that would help dizzy/fatigue) - 7am/11/3pm/7/11 Rotigotine, 6 mg daily (increased last visit)   PREVIOUS MEDICATIONS: carbidopa/levodopa 25/100 IR; clonazepam (dizzy, but patient with chronic dizziness); rotigotine; klonopin (took self off)  ALLERGIES:   Allergies  Allergen Reactions  . Codeine Other (See Comments), Anxiety and Itching    Other Reaction: Other reaction     CURRENT MEDICATIONS:  Outpatient Encounter Medications as of 02/22/2020  Medication Sig  . Ascorbic Acid (VITAMIN C) 1000 MG tablet Take 1,000 mg by mouth in the morning and at bedtime.  Marland Kitchen aspirin EC 81 MG tablet Take 81 mg by mouth daily.  Marland Kitchen atorvastatin (LIPITOR) 40 MG tablet Take 40 mg by mouth daily at 6 PM.   . betamethasone dipropionate 0.05 % lotion APPLY TO SCALP DAILY AS NEEDED FOR ITCHING  . Carbidopa-Levodopa ER (SINEMET CR) 25-100 MG tablet controlled release TAKE 1 TABLET BY MOUTH 5 TIMES DAILY  . cholecalciferol (VITAMIN D) 1000 units tablet Take 1,000 Units by mouth daily.  . clonazePAM (KLONOPIN) 0.5 MG tablet Take 0.5 tablets (0.25 mg total) by mouth at bedtime. (Patient not taking: Reported on 08/22/2019)  . clopidogrel (PLAVIX) 75 MG tablet Take 75 mg by mouth daily.   . Coenzyme Q10 (COQ10) 100 MG CAPS Take 1 capsule by mouth daily.   . folic acid (FOLVITE) 1 MG tablet Take 1 mg by mouth daily.  Marland Kitchen ketoconazole (NIZORAL) 2 % shampoo WASH HAIR AND AROUND NOSE 3 TIMES A WEEK  APPLY TO SCALP LATHER, LET SET FOR 10 MINS, THEN RINSE  . Multiple Vitamins-Minerals (ZINC PO) Take 1 tablet by mouth daily.   . nitroGLYCERIN (NITROSTAT) 0.4 MG SL tablet PLACE ONE TABLET SUBLINGUALLY EVERY 5 MINTUES FOR CHEST PAIN, MAX 3 TABS  . oxyCODONE-acetaminophen (PERCOCET/ROXICET) 5-325 MG tablet Take 1 tablet by mouth in the morning, at noon,  and at bedtime.   . rotigotine (NEUPRO) 6 MG/24HR Place 1 patch onto the skin daily.  . vitamin B-12 (CYANOCOBALAMIN) 1000 MCG tablet Take 1,000 mcg by mouth daily.   No facility-administered encounter medications on file as of 02/22/2020.    Objective:   PHYSICAL EXAMINATION:    VITALS:   There were no vitals filed for this visit.  GEN:  The patient appears stated age and is in NAD. HEENT:  Normocephalic, atraumatic.  The mucous membranes are moist. The superficial temporal arteries are without ropiness or tenderness. CV:  RRR Lungs:  CTAB Neck/HEME:  There are no carotid bruits bilaterally.  Neurological examination:  Orientation: The patient is alert and oriented x3. Cranial nerves: There is good facial symmetry with facial hypomimia. The speech is fluent and clear. Soft palate rises symmetrically and there is no tongue deviation. Hearing is intact to conversational tone. Sensation: Sensation is intact  to light touch throughout Motor: Strength is at least antigravity x4.  Movement examination: Tone: There is mild to mod increased tone in the LUE and mild to mod in the RUE Abnormal movements: there is L>RUE rest tremor Coordination:  There is  decremation with RAM's, with any form of RAMS, including alternating supination and pronation of the forearm, hand opening and closing, finger taps, heel taps and toe taps, L>R Gait and Station: The patient has difficulty arising out of a deep-seated chair without the use of the hands. The patient's stride length is decreased with slowness in the turns.  He has pisa syndrome   I have reviewed and interpreted the following labs independently    Chemistry      Component Value Date/Time   NA 141 07/11/2018 1655   K 3.9 07/11/2018 1655   CL 104 07/11/2018 1655   CO2 26 07/11/2018 1655   BUN 18 07/11/2018 1655   CREATININE 0.95 07/11/2018 1655      Component Value Date/Time   CALCIUM 9.7 07/11/2018 1655       Lab Results   Component Value Date   WBC 7.7 07/11/2018   HGB 15.4 07/11/2018   HCT 47.6 07/11/2018   MCV 92.8 07/11/2018   PLT 243 07/11/2018    No results found for: TSH   Total time spent on today's visit was 45 minutes, including both face-to-face time and nonface-to-face time.  Time included that spent on review of records (prior notes available to me/labs/imaging if pertinent), discussing treatment and goals, answering patient's questions and coordinating care.  Cc:  Quentin Cornwall, MD

## 2020-02-22 ENCOUNTER — Encounter: Payer: Self-pay | Admitting: Neurology

## 2020-02-22 ENCOUNTER — Other Ambulatory Visit: Payer: Self-pay

## 2020-02-22 ENCOUNTER — Ambulatory Visit (INDEPENDENT_AMBULATORY_CARE_PROVIDER_SITE_OTHER): Payer: Medicare Other | Admitting: Neurology

## 2020-02-22 VITALS — BP 138/80 | HR 67 | Resp 20 | Ht 66.5 in | Wt 208.0 lb

## 2020-02-22 DIAGNOSIS — M545 Low back pain, unspecified: Secondary | ICD-10-CM | POA: Diagnosis not present

## 2020-02-22 DIAGNOSIS — G20 Parkinson's disease: Secondary | ICD-10-CM

## 2020-02-22 NOTE — Patient Instructions (Signed)
Online Resources for Power over Parkinson's Group February 2022  . Local [place] Online Groups  o Power over [employer] Group :   - Power Over Parkinson's Patient Education Group will be Wednesday, February 9th at 2pm via Zoom.   - Upcoming Power over Parkinson's Meetings:  2nd Wednesdays of the month at 2 pm:       March 9th, April 13th - Contact Amy Marriott at [email or web address] if interested in participating in this online group o Parkinson's Care Partners Group:    3rd Mondays, Contact Corwin Levins o Atypical Parkinsonian Patient Group:   4th Wednesdays, Contact Corwin Levins o If you are interested in participating in these online groups with Judson Roch, please contact her directly for how to join those meetings.  Her contact information is [email or web address].  She will send you a link to join the [employer].  (Please note that Corwin Levins , MSW, LCSW, has resigned her position at [facility], but will continue to lead the online groups temporarily)  . [place]:  [email or web address] o PD Health at Home continues:  Mindfulness Mondays, Expert Briefing Tuesdays, Wellness Wednesdays, Take Time Thursdays, Fitness Fridays -Listings for February 2022 are on the website o Upcoming Webinar:  Sights, Sounds, and Parkinson's.  Wednesday, February 2 @ 1 pm o Upcoming Webinar:  Conversations about Complementary Therapies and PD.  Wednesday, March 2 @ 1 pm o Geneticist, molecular) at [email or web address] o  Please check out their website to sign up for emails and see their full online offerings  . [place]:  [email or web address]  o Upcoming Webinar:   Moving with Mood Changes in Aging and Parkinson's:  A Look at Depression and Anxiety.  (This is a replay of a webinar originally aired June 2020)  Thursday, February 17 @ 12 noon o Check out additional information on their website to see their full online  offerings  . [place]:  [email or web address] o Upcoming Webinar:  The [place].  The Parkinson's You Don't See:  When your Autonomic System goes Off-Track.   Friday, February 18th.  Go to [email or web address], then click on Events, 12-08-1973 tab to register. o Care Partner Monthly Meetup.  With [employer] [place].  First Tuesday of each month, 2 pm o Check out additional information to Live Well Today on their website  . Parkinson and Movement Disorders (PMD) Alliance:  www.[email or web address] o [employer] Online:  Online Education Events o Sign up for emails, which are sent weekly to give you updates on programming and online offerings . Parkinson's Association of the [place]:  www.[email or web address] o Information on online support groups, education events, and online exercises including Yoga, Parkinson's exercises and more-LOTS of information on links to PD resources and online events o Virtual Support Group through Parkinson's Association of the [place]; next one is scheduled for Wednesday, March 05, 2020 at 2 pm. (These are typically scheduled for the 1st Wednesday of the month at 2 pm).  Visit website for details.  . Additional links for movement activities: o PWR! Moves Classes at [place] RESUMED (but are ON HOLD DUE TO COVID in February)  Contact Amy Shiner, PT [email or web address] or [phone] if interested o Here is a link to the PWR!Moves classes on Zoom from [phone] - Daily Mon-Sat at 10:00. Via Zoom, FREE and open to all.  There is also a link below via Facebook if you use that platform. - [place] - [email or web address] o  Parkinson's Wellness  Recovery (PWR! Moves)  www.[email or web address] - Info on the PWR! Virtual Experience:  You will have access to our expertise through self-assessment, guided plans that start with the PD-specific fundamentals, educational content, tips, Q&A with an expert, and a growing Art therapist of PD-specific pre-recorded and live exercise classes of varying types and intensity - both physical and cognitive! If that is not enough, we offer 1:1 wellness consultations (in-person or virtual) to personalize your PWR! Research scientist (medical).  - Check out the PWR! Move of the month on the [place] Recovery website:  [email or web address] o [employer] Fridays:  - As part of the PD Health @ Home program, this free video series focuses each week on one aspect of fitness designed to support people living with Parkinson's.  These weekly videos highlight the [place] recent fitness guidelines for people with Parkinson's disease. -  [email or web address].dk o Dance for PD website is offering free, live-stream classes throughout the week, as well as links to [employer] of classes:  [email or web address] o Dance for Parkinson's Class:  [place].  Free offering for people with Parkinson's and care partners; virtual class.  o For more information, contact [phone] or email Ruffin Frederick at [email or web address] o Virtual dance and Pilates for Parkinson's classes: Click on the Community Tab> Parkinson's Movement Initiative Tab.  To register for classes and for more information, visit [email or web address] and click the "community" tab.    o [employer] Parkinson's Cycling Classes  - [facility]: 1pm on Fridays-Live classes at [facility] [employer] at [email or web address] or [phone]) [facility] [employer]: Virtual Classes Mondays and Thursdays (contact  [place] at [email or web address] or [phone])   o [place] levels of classes are offered Tuesdays and Thursdays:  10:30 am,  12 noon & 1:45 pm at [facility]. To observe a class or for  more information, call [phone] or email [email or web address] - PD Flow Yoga for Parkinson's:  Fridays 1-2 pm, January 7-February 29, 2020 . [employer]: o Chief Technology Officer Opportunities:  www.well-springsolutions.org/caregiver-education/caregiver-support-group.  You may also contact Vickki Muff at [email or web address] or [phone].   o Virtual Caregiver Retreat, Monday, February 21st, 3-5 pm o Powerful Tools for Caregivers, 6-wk series for caregivers, planning to be in person, starting March 10th o Well-Spring Navigator:  March 23 program, a free service to help individuals and families through the journey of determining care for older adults.  The "Navigator" is a [employer], Education officer, museum, who will speak with a prospective client and/or loved ones to provide an assessment of the situation and a set of recommendations for a personalized care plan -- all free of charge, and whether [employer] offers the needed service or not. If the need is not a service we provide, we are well-connected with reputable programs in town that we can refer you to.  www.well-springsolutions.org or to speak with the Navigator, call [phone].

## 2020-02-22 NOTE — Progress Notes (Signed)
Contacted Dr.Sterns office and spoke with his assistant and she will route message to Dr.Stern.

## 2020-03-04 ENCOUNTER — Telehealth: Payer: Self-pay | Admitting: Neurology

## 2020-03-04 NOTE — Progress Notes (Signed)
Assessment/Plan:   1.  Parkinsons Disease  -Levodopa challenge test was done today on March 07, 2020.  We learned several things from this.  We learned that he does not, in fact, have levodopa resistant tremor.  We learned that he is not on enough levodopa (this was well-known to me prior to this).  We learned that levodopa significantly helped symptoms.  The question really is going to be whether or not he will tolerate going up on levodopa, which I have tried multiple times in the past without success.  He reports that he is currently taking carbidopa/levodopa 25/100 CR, 1.5 tablets 5 times per day.  He was willing to try to go up to 2 tablets 5 times per day (this is likely still not enough but it is certainly worth a try).    -We have tried multiple different medications with the patient because of dizziness, including immediate release, extended release, trialing him off of the rotigotine.  None of these options have made a significant change in his dizziness and we have discussed that I do not think, therefore, that the dizziness is related to Parkinson's.  He has sought 2nd opinions about this and [place] [facility] agreed that they did not think that the dizziness was from Parkinson's disease.  -Continue rotigotine, 6 mg daily (we have tried to discontinue this in the past and dizziness persisted)  -I talked to the patient about the logistics associated with DBS therapy.  I talked to the patient about risks/benefits/side effects of DBS therapy.  We talked about risks which included but were not limited to infection, paralysis, intraoperative seizure, death, stroke, bleeding around the electrode.   I talked to patient about fiducial placement 1 week prior to DBS therapy.  I talked to the patient about what to expect in the operating room, including the fact that this is an awake surgery.  We talked about battery placement as well as which is done under general anesthesia, generally approximately one  week following the initial surgery.  We also talked about the fact that the patient will need to be off of medications for surgery.  The patient and family were given the opportunity to ask questions, which they did, and I answered them to the best of my ability today.  If he would like to consider this more, he would do neurocognitive testing and I told him that he could schedule this before next visit if he would like.  He and his wife will talk about this further.  2.  Restless leg  -discussed with pt/daughter that can be associated with Parkinsons Disease  -took self off of low dose klonopin.  He would like to go back on it.  Because he is seeing pain management and has a narcotic contract, he will need to get that from his pain management physician.  We discussed [place] STOP act.  3.  Dizziness  -As above, chronic, but disabling the patient.  Do not think that it is related to Parkinson's disease, and he had a 2nd opinion at [facility] and they did not think so either.  We have tried to reworked his medications and this did not significantly help.  In addition, he came in off of levodopa for over 24 hours and still had dizziness today.  -Cardiologist has placed him on meclizine.  4.  Chronic low back pain  -This is another disabling feature for the patient.  He has had multiple back surgeries.  This has prevented him  from exercising, which also has affected the Parkinson's disease.  He has followed with Dr. Maryjean Ka in the past as well as Dr. Vertell Limber.  Given information to online exercise/biking program.    Subjective:   Brett Patterson was seen today in follow up for Parkinsons disease.  My previous records were reviewed prior to todays visit as well as outside records available to me.   Patient with wife who supplements history.  Patient is here for levodopa challenge test.  Patient has been off the rotigotine patch for over 24 hours.  He has been off of levodopa for over 24 hours.  States that he has  had a bad 24 hours.  Has had significant RLS.  He had trouble sleeping because of this.  His pain has made this worse.  Current prescribed movement disorder medications: Carbidopa/levodopa 25/100 CR, 1 tablet 5 times per day -7am/11/3pm/7/11 Rotigotine, 6 mg daily   PREVIOUS MEDICATIONS: carbidopa/levodopa 25/100 IR; clonazepam (dizzy, but patient with chronic dizziness); rotigotine; klonopin (took self off)  ALLERGIES:   Allergies  Allergen Reactions  . Latex     Oral irritation   . Codeine Anxiety    Codeine based cough medicine     CURRENT MEDICATIONS:  Outpatient Encounter Medications as of 03/07/2020  Medication Sig  . Ascorbic Acid (VITAMIN C) 1000 MG tablet Take 1,000 mg by mouth daily.  Marland Kitchen aspirin EC 81 MG tablet Take 81 mg by mouth daily.  Marland Kitchen atorvastatin (LIPITOR) 80 MG tablet Take 80 mg by mouth daily.  . calcium carbonate (TUMS - DOSED IN MG ELEMENTAL CALCIUM) 500 MG chewable tablet Chew 1,000-1,500 mg by mouth daily as needed for indigestion or heartburn.  . Carbidopa-Levodopa ER (SINEMET CR) 25-100 MG tablet controlled release TAKE 1 TABLET BY MOUTH 5 TIMES DAILY (Patient taking differently: Take 1 tablet by mouth at bedtime.)  . Cholecalciferol (VITAMIN D3) 1.25 MG (50000 UT) TABS Take 50,000 Units by mouth every Monday. With a probiotic  . clopidogrel (PLAVIX) 75 MG tablet Take 75 mg by mouth daily.   . Coenzyme Q10 (COQ10) 100 MG CAPS Take 100 mg by mouth daily.  . folic acid (FOLVITE) 1 MG tablet Take 1 mg by mouth daily.  . Ginger, Zingiber officinalis, (GINGER PO) Take 7 drops by mouth 3 (three) times daily. Liquid ginger supplement  . GLUTATHIONE PO Take 1 tablet by mouth daily.  Marland Kitchen ketoconazole (NIZORAL) 2 % shampoo Apply 1 application topically daily as needed for irritation.  Marland Kitchen latanoprost (XALATAN) 0.005 % ophthalmic solution Place 1 drop into both eyes at bedtime.  Marland Kitchen MAGNESIUM CITRATE PO Take 1 tablet by mouth daily.  . nitroGLYCERIN (NITROSTAT) 0.4 MG SL  tablet Place 0.4 mg under the tongue every 5 (five) minutes as needed for chest pain.  Marland Kitchen OVER THE COUNTER MEDICATION Take 1 capsule by mouth 2 (two) times daily. nerve renew otc supplement  . OVER THE COUNTER MEDICATION Take 1 tablet by mouth daily. Livit-2  . OVER THE COUNTER MEDICATION Take 1 Dose by mouth in the morning and at bedtime. Para-A liquid supplement  . oxyCODONE-acetaminophen (PERCOCET/ROXICET) 5-325 MG tablet Take 1 tablet by mouth every 8 (eight) hours as needed for moderate pain.  . rotigotine (NEUPRO) 6 MG/24HR Place 1 patch onto the skin daily.  . timolol (TIMOPTIC) 0.25 % ophthalmic solution Place 1 drop into both eyes daily.  Marland Kitchen triamcinolone (KENALOG) 0.025 % cream Apply 1 application topically 2 (two) times daily as needed for irritation.  . Zinc 50  MG CAPS Take 50 mg by mouth daily.  . [DISCONTINUED] carbidopa-levodopa (SINEMET IR) 25-100 MG tablet Take 1 tablet by mouth every 4 (four) hours.  . carbidopa-levodopa (SINEMET IR) 25-100 MG tablet Take 2 tablets by mouth 5 (five) times daily.  . [DISCONTINUED] atorvastatin (LIPITOR) 40 MG tablet Take 80 mg by mouth daily at 6 PM.  . [DISCONTINUED] betamethasone dipropionate 0.05 % lotion APPLY TO SCALP DAILY AS NEEDED FOR ITCHING  . [DISCONTINUED] cholecalciferol (VITAMIN D) 1000 units tablet Take 1,000 Units by mouth daily.  . [DISCONTINUED] clonazePAM (KLONOPIN) 0.5 MG tablet Take 0.5 tablets (0.25 mg total) by mouth at bedtime. (Patient not taking: No sig reported)  . [DISCONTINUED] Multiple Vitamins-Minerals (ZINC PO) Take 1 tablet by mouth daily.   . [DISCONTINUED] vitamin B-12 (CYANOCOBALAMIN) 1000 MCG tablet Take 1,000 mcg by mouth daily.  . [EXPIRED] carbidopa-levodopa (SINEMET IR) 25-100 MG per tablet immediate release 3 tablet    No facility-administered encounter medications on file as of 03/07/2020.    Objective:   PHYSICAL EXAMINATION:    VITALS:   Vitals:   03/07/20 0902  BP: 130/72  Pulse: 70  SpO2: 98%   Weight: 209 lb (94.8 kg)  Height: 5\' 7"  (1.702 m)    GEN:  The patient appears stated age and is in NAD. HEENT:  Normocephalic, atraumatic.  The mucous membranes are moist. The superficial temporal arteries are without ropiness or tenderness. CV:  RRR Lungs:  CTAB Neck/HEME:  There are no carotid bruits bilaterally.  Neurological examination:  Orientation: The patient is alert and oriented x3. Cranial nerves: There is good facial symmetry with facial hypomimia. The speech is fluent and clear. Soft palate rises symmetrically and there is no tongue deviation. Hearing is intact to conversational tone. Sensation: Sensation is intact to light touch throughout Motor: Strength is at least antigravity x4.  Levodopa challenge done today.  UPDRS motor off score was 53.  Pt then given 300 mg of levodopa dissolved in ginger ale and waited 40 minutes to re-examine him.  UPDRS motor on score was 23.  Details of UPDRS motor score documented on separate neurophysiologic worksheet.     Movement examination: Tone: Prior to the addition of levodopa, rigidity was worse on the right and was at least moderate.  Following the addition of levodopa, there was only slight rigidity on the right and mild on the left. Abnormal movements: Prior to the addition of levodopa, there was bilateral upper and right lower extremity resting tremor.  Following the addition of levodopa, there was just some intermittent left upper extremity rest tremor Coordination:  There is  decremation with RAM's, with any form of RAMS, including alternating supination and pronation of the forearm, hand opening and closing, finger taps, heel taps and toe taps,  Gait and Station: The patient was unable to arise without the use of his hands prior to the addition of levodopa.  Following the addition of levodopa, he was able to arise without the use of his hands (took several attempts).  He had a positive pull test prior to the addition of levodopa  (he would have actually fallen spontaneously).  Following the addition of levodopa, he had a negative pull test.  I have reviewed and interpreted the following labs independently    Chemistry      Component Value Date/Time   NA 141 07/11/2018 1655   K 3.9 07/11/2018 1655   CL 104 07/11/2018 1655   CO2 26 07/11/2018 1655   BUN 18 07/11/2018  1655   CREATININE 0.95 07/11/2018 1655      Component Value Date/Time   CALCIUM 9.7 07/11/2018 1655       Lab Results  Component Value Date   WBC 7.7 07/11/2018   HGB 15.4 07/11/2018   HCT 47.6 07/11/2018   MCV 92.8 07/11/2018   PLT 243 07/11/2018    No results found for: TSH   Total time spent on today's visit was 75 minutes, including both face-to-face time and nonface-to-face time.  Time included that spent on review of records (prior notes available to me/labs/imaging if pertinent), discussing treatment and goals, answering patient's questions and coordinating care.  Cc:  Quentin Cornwall, MD

## 2020-03-04 NOTE — Telephone Encounter (Signed)
Call patient and remind him that levodopa challenge test is scheduled for Friday and that he needs to pull off his rotigitine patch after NOON on Wednesday (03/05/20) and he needs to hold all carbidopa/levodopa 25/100 after his last dosage on 03/05/20 (so he can take that all day on Wednesday but none on Thursday or Friday morning until he gets here when we will give it to him)

## 2020-03-05 DIAGNOSIS — I10 Essential (primary) hypertension: Secondary | ICD-10-CM | POA: Diagnosis not present

## 2020-03-05 DIAGNOSIS — R5383 Other fatigue: Secondary | ICD-10-CM | POA: Diagnosis not present

## 2020-03-05 DIAGNOSIS — I739 Peripheral vascular disease, unspecified: Secondary | ICD-10-CM | POA: Diagnosis not present

## 2020-03-05 DIAGNOSIS — E782 Mixed hyperlipidemia: Secondary | ICD-10-CM | POA: Diagnosis not present

## 2020-03-05 NOTE — Telephone Encounter (Signed)
Spoke with patients wife and gave her Dr Doristine Devoid medication instructions. She voiced understanding. She states the patient was sleep. Advised her to have the patient contact the office if he has any questions or concerns. She voiced understanding.

## 2020-03-07 ENCOUNTER — Other Ambulatory Visit: Payer: Self-pay

## 2020-03-07 ENCOUNTER — Ambulatory Visit (INDEPENDENT_AMBULATORY_CARE_PROVIDER_SITE_OTHER): Payer: Medicare Other | Admitting: Neurology

## 2020-03-07 ENCOUNTER — Encounter: Payer: Self-pay | Admitting: Neurology

## 2020-03-07 VITALS — BP 130/72 | HR 70 | Ht 67.0 in | Wt 209.0 lb

## 2020-03-07 DIAGNOSIS — G4752 REM sleep behavior disorder: Secondary | ICD-10-CM

## 2020-03-07 DIAGNOSIS — G20 Parkinson's disease: Secondary | ICD-10-CM | POA: Diagnosis not present

## 2020-03-07 DIAGNOSIS — G2581 Restless legs syndrome: Secondary | ICD-10-CM

## 2020-03-07 MED ORDER — CARBIDOPA-LEVODOPA 25-100 MG PO TABS
3.0000 | ORAL_TABLET | Freq: Once | ORAL | Status: AC
  Administered 2020-03-07: 3 via ORAL

## 2020-03-07 MED ORDER — CARBIDOPA-LEVODOPA 25-100 MG PO TABS: 2.0000 | ORAL_TABLET | Freq: Every day | ORAL | 0 refills | Status: AC

## 2020-03-07 NOTE — Patient Instructions (Addendum)
Let us know if you decide to look further into surgery and we will schedule cognitive testing.  If we do this you will need to know:   Please bring someone with you to this appointment if possible, as it is helpful for the neuropsychologist to hear from both you and another adult who knows you well. Please bring eyeglasses and hearing aids if you wear them.    The evaluation will take approximately 3 hours and has two parts:   . The first part is a clinical interview with the neuropsychologist, Dr. Melvyn Novas or Dr. Nicole Kindred. During the interview, the neuropsychologist will speak with you and the individual you brought to the appointment.    . The second part of the evaluation is testing with the doctor's technician, aka psychometrician, Hinton Dyer or [employer]. During the testing, the technician will ask you to remember different types of material, solve problems, and answer some questionnaires. Your family member will not be present for this portion of the evaluation.   Please note: We have to reserve several hours of the neuropsychologist's time and the psychometrician's time for your evaluation appointment. As such, there is a No-Show fee of $100. If you are unable to attend any of your appointments, please contact our office as soon as possible to reschedule.    Deep Brain Stimulation  Is it the right choice for me?   What is Deep Brain Stimulation (DBS) Surgery?  DBS is a surgical procedure used to treat symptoms of Parkinson's disease (PD). It involves the implantation of an electrode into the brain (one on each side). The area of the brain that is typically targeted in PD is the subthalamic nucleus.   How does DBS work?  PD is caused by the degeneration of brain cells in a specific part of the brain which make a chemical (neurotransmitter) called dopamine. As time goes by, more and more cells degenerate and the level of dopamine in the brain declines. As a result of this dopamine deficiency, there is a  certain circuit in the brain which becomes abnormally overactive. Many symptoms of PD are due to this abnormal, overactive circuit. With DBS, high frequency electrical stimulation is used to disrupt this circuit, thereby blocking the symptoms of PD that were previously being mediated through that circuit. The three main symptoms of PD are shaking (tremor), slowness of movement (bradykinesia), and stiffness (rigidity). All of these symptoms are mediated through this small circuit. Therefore, DBS is very effective in blocking these symptoms. It is important to remember, however, that DBS does not "cure" PD, but rather is a very effective method of treating the symptoms of the disease.   What is actually done during the operation?  The surgical procedure involves the implantation of 2 electrodes (one on each side of the brain). The electrodes are connected to 2 wires, which are then connected to a generator- pacemaker like device (either one or two) in the chest. The generator (and wires) are placed under the skin similar to a cardiac pacemaker, thus the device itself is not visible. The implanted hardware does, however, produce a lump on the chest where the generator is placed and two small bumps on the scalp where underneath there are small plastic caps which are screwed into the skull and secure the electrode.   What symptoms can I expect DBS to treat?  DBS treats many, but not all symptoms of PD. As mentioned above, tremor, stiffness (rigidity), and slowness of movement (bradykinesia) all respond well to  DBS therapy. In addition, many patients with advanced PD have problems with what we call "motor fluctuations". This refers to the wearing off of medication before the next dose associated with breakthrough of PD symptoms, and at other times the effects of excess medication, such as involuntary wiggling (dyskinesia). Because the electrical stimulation is constant, the effect is continuous. Therefore motor  fluctuations can be significantly reduced. Furthermore, after DBS most patients are able to significantly reduce the amount of Parkinson's medications they were previously taking. Therefore, side effects of these medications can be significantly reduced as well, and often completely eliminated. Common anti-Parkinson medication side effects include: involuntary wiggling (dyskinesia), visual distortions and hallucinations, nausea and vomiting, and lightheadedness.   What symptoms are not treated with DBS?  Some symptoms of PD are mediated through other brain circuits. Therefore, those symptoms would not be expected to improve with DBS. These symptoms include: soft and mumbled speech (hypophonia), balance trouble, and memory deficits. Even if the DBS surgery is done perfectly, the patient will still have PD. Therefore, because DBS does not block all of the effected brain circuits, the above mentioned symptoms will likely continue to progress and worsen with time.   How functional can I expect to be following DBS surgery?  Most patients who are good candidates improve with DBS. Think about how functional you are now, when your medicines are "kicked in" and working at their very best. After DBS we can often get you to that point and keep you there, without all of the fluctuations and the medication side effects. Some patients with PD have bad tremor that does not respond well to medication. DBS can work well to control tremor even when medication cannot.   What are the risks of surgery?  Because the surgery involves introducing a foreign object into the brain, there are inherent risks that are present. First, there is a very small risk, approximately 1%, of having bleeding into the brain causing symptoms similar to that of a stroke. Secondly, there is a 5-7% chance of having an infection related to the procedure. If the device gets infected, then the treatment usually requires that the infected hardware be  removed temporarily while antibiotics are given. After the infection is resolved, then the hardware needs to be re-implanted. This would not leave the patient with permanent problems, but it is easy to understand how disappointed someone might be if they have to go through the surgery again. Typical symptoms of infection include redness, swelling, or pain around the device on the skin. There is theoretically a very small chance (much less than 1%) that an infection could spread to the brain. This, of course, would be much more serious. Another small risk of brain surgery is possible seizure (2-3%). A seizure produces transient sudden loss of consciousness and generalized shaking (convulsion). This can be caused by irritation of the brain during the operation. If a seizure occurs, it is almost always at the time of operation. It may require temporarily being treated with seizure medications, but this is typically only short term.   How much trouble is it to get DBS?  Unfortunately, undergoing DBS surgery is a process involving multiple steps. Even prior to surgery, there are several steps that must be done. The surgery itself takes place in three separate parts. About a week prior to insertion of the electrodes, you will be seen in an ambulatory surgery center to put in markers into the skull, called fiducials. This allows [place] to plan the  surgery and to better localize the area in which we will operate. One week later, stage 1 of the procedure will be done in which the electrodes are implanted. Approximately one week after this, stage 2 of the surgery will be done in which the generator (battery) is inserted. Stage 1 of the surgery usually takes several hours. This is when the electrode is placed. This surgery has to be done while the patient is awake. Local anesthesia is used, so the procedure is not generally very painful, but obviously it is a little scary to be operated on while you are awake. Furthermore,  patients need to be off of their anti-Parkinson medication during the operation, so that we can more easily identify the abnormal circuit in the brain. It is unpleasant being off anti-Parkinson medication, even for this short time. Approximately 6 weeks after the electrode placement, programming of the device will take place at our first in office clinic visit. This allows [place] to alter the type of stimulation and optimize the beneficial effects. This is done over several clinic visits. The second post op clinic visit is usually just a week after the first, but subsequent visits will be less frequent. Eventually you shouldn't need to be seen more than once every 4-6 months or so. Overall, you should expect several programming visits before you see the full benefit from DBS surgery.   How long does the hardware last?  The generator runs on a battery inside the device. How long the battery lasts depends on the manufacturer of the device and whether you choose are rechargeable device or a traditional battery.  A traditional battery may last 3 to 5 years and a rechargeable device may last 15-20 years. The battery replacement operation, however, is much easier than the initial elaborate operation. It is typically done as an outpatient procedure.   Does DBS always work?  The key to success is exact placement of the electrode. As you can imagine, the brain has many circuits which are closely packed together. If the electrode is close to being in the right position, but not quite, then there may be a partial response rather than a complete response. If this happens, we may have to turn up the stimulation to try and more completely block the circuit. If we do this, however, we may effect other adjacent circuits that we are not intending to effect, and thereby produce stimulation-related side effects such as slurred speech or facial muscle pulling. These side effects can be easily eliminated by reducing the strength of  the stimulation, but then some of the Parkinson symptoms may break through.   Is DBS the right choice for you?  As you can now see, there are many things that carefully need to be considered when making this decision. DBS is not appropriate for all patients with PD. The ultimate decision is yours to make. It is our job to provide you with all the pros and cons, so that you can make the choice that is right for you.  Logistical Details: Pre-Operative Visits:  1) "On-Off" Testing. This visit takes place in the clinic with Dr. Carles Collet several weeks prior to surgery to help determine if you are a good DBS candidate. You will come to the clinic having NOT TAKEN your PD medications. A series of physical examination tests will be done. Then you will be given a dose of carbidopa/levodopa dissolved in ginger ale. Approximately 30 minutes later you will be re-examined with the same tests to  see how you respond.   IT IS EXTREMELY IMPORTANT NOT TO TAKE YOUR PARKINSON MEDICATIONS ON THE DAY OF THIS CLINIC VISIT.   2) Neuropsychological Testing. This is standard testing in all potential candidates to help determine those patients that may be at risk for developing worsening cognition from the procedure. This is a long clinic visit (multiple hours).  3) Pre-Operative MRI. If you are deemed to be a good surgical candidate based on the above 2 visits, you will need to have MRI imaging done. It is very important that we get high quality study. You must have someone accompany you to this visit as we may have to give you sedation in order to make sure the MRI images are of adequate quality.  What to expect regarding surgery:  1. The first step involves placement of the fiducial (reference) pins. This is done the week before surgery by Dr. Vertell Limber. You are given 4 local injections of anesthetic (numbing medication). Next, 4 pins are screwed into the skull. Following the placement of the pins, you will be transferred down to have a  head CT scan. The CT is used in planning for the surgery.  2. Surgery typically takes place one week later. You will have been off all of your Parkinson medications. This can be quite uncomfortable! 3. You will have the sense of "hurry up and wait" multiple times throughout the day, but it is extremely important to remain as patient as possible. It is during these times that we are busy "behind the scenes" doing the surgical planning. 4. In the pre-op area, you will meet with the nurses and anesthesia staff. You may have a catheter placed into the bladder. Once you are taken back to the OR suite, you will be placed in a "beach chair" position. You will not be under general anesthesia. We need you to be awake during certain parts to allow [place] to do important testing. The actual surgical procedure is not generally painful. It is done with local anesthetic agents. However, the procedure can take many hours, and it is expected that you'll become uncomfortable. We try to minimize any sedating medications, but can give you something if needed.  5. You will have a bad haircut, but it will grow back!  6. Following the surgery, you will stay overnight in the hospital for observation.  7. The following day, you will have either an MRI brain or a CT brain to allow [place] to evaluate the placement of the electrodes and also to make sure there were no bleeding complications. Provided there are no complications, you will be discharged home the day after surgery.    It is extremely important to remember that after having DBS surgery, you will need to notify your physicians before you have an MRI.  Most DBS electrodes ARE now MRI compatible but the DBS must be placed in a special mode before the MRI is completed for your safety.  8. About 1 week later you will return for an outpatient surgery that lasts 1-2 hours during which the generator(s) will be placed. You will go home on the same day as the surgery. You will find that  you are more uncomfortable after this surgery than your first surgery. You will be given medications to help with this. The pain from this surgery usually resolves in 2 or 3 days.

## 2020-03-12 DIAGNOSIS — J324 Chronic pansinusitis: Secondary | ICD-10-CM | POA: Diagnosis not present

## 2020-03-12 DIAGNOSIS — M7989 Other specified soft tissue disorders: Secondary | ICD-10-CM | POA: Diagnosis not present

## 2020-03-14 NOTE — Patient Instructions (Signed)
DUE TO COVID-19 ONLY ONE VISITOR IS ALLOWED TO COME WITH YOU AND STAY IN THE WAITING ROOM ONLY DURING PRE OP AND PROCEDURE DAY OF SURGERY. THE 1 VISITOR  MAY VISIT WITH YOU AFTER SURGERY IN YOUR PRIVATE ROOM DURING VISITING HOURS ONLY!  YOU NEED TO HAVE A COVID 19 TEST ON: 03/17/20 @ 11:50 AM, THIS TEST MUST BE DONE BEFORE SURGERY,  COVID TESTING SITE [place] [address], IT IS ON THE RIGHT GOING OUT [address] APPROXIMATELY  2 MINUTES PAST [employer] ON THE RIGHT. ONCE YOUR COVID TEST IS COMPLETED,  PLEASE BEGIN THE QUARANTINE INSTRUCTIONS AS OUTLINED IN YOUR HANDOUT.                Brett Patterson   Your procedure is scheduled on: 03/20/20   Report to [facility] Main  Entrance   Report to admitting at: 7:30 AM     Call this number if you have problems the morning of surgery [phone]    Remember:   NO SOLID FOOD AFTER MIDNIGHT THE NIGHT PRIOR TO SURGERY. NOTHING BY MOUTH EXCEPT CLEAR LIQUIDS UNTIL: 7:00 AM . PLEASE FINISH ENSURE DRINK PER SURGEON ORDER  WHICH NEEDS TO BE COMPLETED AT: 7:00 AM .  CLEAR LIQUID DIET  Foods Allowed                                                                     Foods Excluded  Coffee and tea, regular and decaf                             liquids that you cannot  Plain Jell-O any favor except red or purple                                           see through such as: Fruit ices (not with fruit pulp)                                     milk, soups, orange juice  Iced Popsicles                                    All solid food Carbonated beverages, regular and diet                                    Cranberry, grape and apple juices Sports drinks like Gatorade Lightly seasoned clear broth or consume(fat free) Sugar, honey syrup  Sample Menu Breakfast                                Lunch  Supper Cranberry juice                    Beef broth                             Chicken broth Jell-O                                     Grape juice                           Apple juice Coffee or tea                        Jell-O                                      Popsicle                                                Coffee or tea                        Coffee or tea  _____________________________________________________________________   BRUSH YOUR TEETH MORNING OF SURGERY AND RINSE YOUR MOUTH OUT, NO CHEWING GUM CANDY OR MINTS.   Take these medicines the morning of surgery with A SIP OF WATER: carbidopa.Use eye drops as usual.                               You may not have any metal on your body including hair pins and              piercings  Do not wear jewelry, lotions, powders or perfumes, deodorant             Men may shave face and neck.   Do not bring valuables to the hospital. [place].  Contacts, dentures or bridgework may not be worn into surgery.  Leave suitcase in the car. After surgery it may be brought to your room.     Patients discharged the day of surgery will not be allowed to drive home. IF YOU ARE HAVING SURGERY AND GOING HOME THE SAME DAY, YOU MUST HAVE AN ADULT TO DRIVE YOU HOME AND BE WITH YOU FOR 24 HOURS. YOU MAY GO HOME BY TAXI OR UBER OR ORTHERWISE, BUT AN ADULT MUST ACCOMPANY YOU HOME AND STAY WITH YOU FOR 24 HOURS.  Name and phone number of your driver:  Special Instructions: N/A              Please read over the following fact sheets you were given: _____________________________________________________________________         [facility] - Preparing for Surgery Before surgery, you can play an important role.  Because skin is not sterile, your skin needs to be as free of germs as possible.  You can reduce the number of germs on your skin by washing with  CHG (chlorahexidine gluconate) soap before surgery.  CHG is an antiseptic cleaner which kills germs and bonds with the skin to  continue killing germs even after washing. Please DO NOT use if you have an allergy to CHG or antibacterial soaps.  If your skin becomes reddened/irritated stop using the CHG and inform your nurse when you arrive at Short Stay. Do not shave (including legs and underarms) for at least 48 hours prior to the first CHG shower.  You may shave your face/neck. Please follow these instructions carefully:  1.  Shower with CHG Soap the night before surgery and the  morning of Surgery.  2.  If you choose to wash your hair, wash your hair first as usual with your  normal  shampoo.  3.  After you shampoo, rinse your hair and body thoroughly to remove the  shampoo.                           4.  Use CHG as you would any other liquid soap.  You can apply chg directly  to the skin and wash                       Gently with a scrungie or clean washcloth.  5.  Apply the CHG Soap to your body ONLY FROM THE NECK DOWN.   Do not use on face/ open                           Wound or open sores. Avoid contact with eyes, ears mouth and genitals (private parts).                       Wash face,  Genitals (private parts) with your normal soap.             6.  Wash thoroughly, paying special attention to the area where your surgery  will be performed.  7.  Thoroughly rinse your body with warm water from the neck down.  8.  DO NOT shower/wash with your normal soap after using and rinsing off  the CHG Soap.                9.  Pat yourself dry with a clean towel.            10.  Wear clean pajamas.            11.  Place clean sheets on your bed the night of your first shower and do not  sleep with pets. Day of Surgery : Do not apply any lotions/deodorants the morning of surgery.  Please wear clean clothes to the hospital/surgery center.  FAILURE TO FOLLOW THESE INSTRUCTIONS MAY RESULT IN THE CANCELLATION OF YOUR SURGERY PATIENT SIGNATURE_________________________________  NURSE  SIGNATURE__________________________________  ________________________________________________________________________  [facility]- Preparing for Total Shoulder Arthroplasty    Before surgery, you can play an important role. Because skin is not sterile, your skin needs to be as free of germs as possible. You can reduce the number of germs on your skin by using the following products. . Benzoyl Peroxide Gel o Reduces the number of germs present on the skin o Applied twice a day to shoulder area starting two days before surgery    ==================================================================  Please follow these instructions carefully:  BENZOYL PEROXIDE 5% GEL  Please do not use if you have an allergy  to benzoyl peroxide.   If your skin becomes reddened/irritated stop using the benzoyl peroxide.  Starting two days before surgery, apply as follows: 1. Apply benzoyl peroxide in the morning and at night. Apply after taking a shower. If you are not taking a shower clean entire shoulder front, back, and side along with the armpit with a clean wet washcloth.  2. Place a quarter-sized dollop on your shoulder and rub in thoroughly, making sure to cover the front, back, and side of your shoulder, along with the armpit.   2 days before ____ AM   ____ PM              1 day before ____ AM   ____ PM                         3. Do this twice a day for two days.  (Last application is the night before surgery, AFTER using the CHG soap as described below).  4. Do NOT apply benzoyl peroxide gel on the day of surgery.  Incentive Spirometer  An incentive spirometer is a tool that can help keep your lungs clear and active. This tool measures how well you are filling your lungs with each breath. Taking long deep breaths may help reverse or decrease the chance of developing breathing (pulmonary) problems (especially infection) following:  A long period of time when you are unable to move or be  active. BEFORE THE PROCEDURE   If the spirometer includes an indicator to show your best effort, your nurse or respiratory therapist will set it to a desired goal.  If possible, sit up straight or lean slightly forward. Try not to slouch.  Hold the incentive spirometer in an upright position. INSTRUCTIONS FOR USE  1. Sit on the edge of your bed if possible, or sit up as far as you can in bed or on a chair. 2. Hold the incentive spirometer in an upright position. 3. Breathe out normally. 4. Place the mouthpiece in your mouth and seal your lips tightly around it. 5. Breathe in slowly and as deeply as possible, raising the piston or the ball toward the top of the column. 6. Hold your breath for 3-5 seconds or for as long as possible. Allow the piston or ball to fall to the bottom of the column. 7. Remove the mouthpiece from your mouth and breathe out normally. 8. Rest for a few seconds and repeat Steps 1 through 7 at least 10 times every 1-2 hours when you are awake. Take your time and take a few normal breaths between deep breaths. 9. The spirometer may include an indicator to show your best effort. Use the indicator as a goal to work toward during each repetition. 10. After each set of 10 deep breaths, practice coughing to be sure your lungs are clear. If you have an incision (the cut made at the time of surgery), support your incision when coughing by placing a pillow or rolled up towels firmly against it. Once you are able to get out of bed, walk around indoors and cough well. You may stop using the incentive spirometer when instructed by your caregiver.  RISKS AND COMPLICATIONS  Take your time so you do not get dizzy or light-headed.  If you are in pain, you may need to take or ask for pain medication before doing incentive spirometry. It is harder to take a deep breath if you are having pain. AFTER USE  Rest and breathe slowly and easily.  It can be helpful to keep track of a log of  your progress. Your caregiver can provide you with a simple table to help with this. If you are using the spirometer at home, follow these instructions: [place] IF:   You are having difficultly using the spirometer.  You have trouble using the spirometer as often as instructed.  Your pain medication is not giving enough relief while using the spirometer.  You develop fever of 100.5 F (38.1 C) or higher. SEEK IMMEDIATE MEDICAL CARE IF:   You cough up bloody sputum that had not been present before.  You develop fever of 102 F (38.9 C) or greater.  You develop worsening pain at or near the incision site. MAKE SURE YOU:   Understand these instructions.  Will watch your condition.  Will get help right away if you are not doing well or get worse. Document Released: 05/03/2006 Document Revised: 03/15/2011 Document Reviewed: 07/04/2006 [facility] Patient Information 2014 [place], [place].   ________________________________________________________________________

## 2020-03-17 ENCOUNTER — Other Ambulatory Visit (HOSPITAL_COMMUNITY)
Admission: RE | Admit: 2020-03-17 | Discharge: 2020-03-17 | Disposition: A | Discharge status: Home / Self Care | Payer: BC Managed Care – PPO | Source: Ambulatory Visit | Attending: Orthopedic Surgery | Admitting: Orthopedic Surgery

## 2020-03-17 ENCOUNTER — Other Ambulatory Visit: Payer: Self-pay

## 2020-03-17 ENCOUNTER — Encounter (HOSPITAL_COMMUNITY)
Admission: RE | Admit: 2020-03-17 | Discharge: 2020-03-17 | Disposition: A | Discharge status: Home / Self Care | Payer: BC Managed Care – PPO | Source: Ambulatory Visit | Attending: Orthopedic Surgery | Admitting: Orthopedic Surgery

## 2020-03-17 ENCOUNTER — Encounter (HOSPITAL_COMMUNITY): Payer: Self-pay

## 2020-03-17 DIAGNOSIS — Z951 Presence of aortocoronary bypass graft: Secondary | ICD-10-CM | POA: Diagnosis not present

## 2020-03-17 DIAGNOSIS — I2510 Atherosclerotic heart disease of native coronary artery without angina pectoris: Secondary | ICD-10-CM | POA: Diagnosis not present

## 2020-03-17 DIAGNOSIS — Z20822 Contact with and (suspected) exposure to covid-19: Secondary | ICD-10-CM | POA: Insufficient documentation

## 2020-03-17 DIAGNOSIS — Z7982 Long term (current) use of aspirin: Secondary | ICD-10-CM | POA: Insufficient documentation

## 2020-03-17 DIAGNOSIS — I10 Essential (primary) hypertension: Secondary | ICD-10-CM | POA: Diagnosis not present

## 2020-03-17 DIAGNOSIS — K219 Gastro-esophageal reflux disease without esophagitis: Secondary | ICD-10-CM | POA: Insufficient documentation

## 2020-03-17 DIAGNOSIS — M75101 Unspecified rotator cuff tear or rupture of right shoulder, not specified as traumatic: Secondary | ICD-10-CM | POA: Diagnosis not present

## 2020-03-17 DIAGNOSIS — Z79899 Other long term (current) drug therapy: Secondary | ICD-10-CM | POA: Insufficient documentation

## 2020-03-17 DIAGNOSIS — Z7902 Long term (current) use of antithrombotics/antiplatelets: Secondary | ICD-10-CM | POA: Diagnosis not present

## 2020-03-17 DIAGNOSIS — Z01812 Encounter for preprocedural laboratory examination: Secondary | ICD-10-CM | POA: Insufficient documentation

## 2020-03-17 DIAGNOSIS — M75121 Complete rotator cuff tear or rupture of right shoulder, not specified as traumatic: Secondary | ICD-10-CM | POA: Diagnosis not present

## 2020-03-17 HISTORY — DX: Unspecified osteoarthritis, unspecified site: M19.90

## 2020-03-17 HISTORY — DX: Malignant (primary) neoplasm, unspecified: C80.1

## 2020-03-17 LAB — CBC
HCT: 44.4 % (ref 39.0–52.0)
Hemoglobin: 14.5 g/dL (ref 13.0–17.0)
MCH: 31.2 pg (ref 26.0–34.0)
MCHC: 32.7 g/dL (ref 30.0–36.0)
MCV: 95.5 fL (ref 80.0–100.0)
Platelets: 212 K/uL (ref 150–400)
RBC: 4.65 MIL/uL (ref 4.22–5.81)
RDW: 13.4 % (ref 11.5–15.5)
WBC: 6.9 K/uL (ref 4.0–10.5)
nRBC: 0 % (ref 0.0–0.2)

## 2020-03-17 LAB — NO BLOOD PRODUCTS

## 2020-03-17 LAB — BASIC METABOLIC PANEL
Anion gap: 11 (ref 5–15)
BUN: 23 mg/dL (ref 8–23)
CO2: 25 mmol/L (ref 22–32)
Calcium: 9.5 mg/dL (ref 8.9–10.3)
Chloride: 102 mmol/L (ref 98–111)
Creatinine, Ser: 0.76 mg/dL (ref 0.61–1.24)
GFR, Estimated: >60 mL/min (ref >60)
Glucose, Bld: 108 mg/dL — ABNORMAL HIGH (ref 70–99)
Potassium: 4 mmol/L (ref 3.5–5.1)
Sodium: 138 mmol/L (ref 135–145)

## 2020-03-17 LAB — SURGICAL PCR SCREEN
MRSA, PCR: NEGATIVE
Staphylococcus aureus: NEGATIVE

## 2020-03-17 LAB — SARS CORONAVIRUS 2 (TAT 6-24 HRS): SARS Coronavirus 2: NEGATIVE

## 2020-03-17 NOTE — Progress Notes (Signed)
Pt. Refuse blood transfusion. 

## 2020-03-17 NOTE — Progress Notes (Addendum)
COVID Vaccine Completed: NO Date COVID Vaccine completed: COVID vaccine manufacturer: Pfizer    [employer]   PCP - Dr. Hassell Done Cardiologist - Dr. Angela Cox Zagol.  Chest x-ray -  EKG -  Stress Test -  ECHO -  Cardiac Cath -  Pacemaker/ICD device last checked:  Sleep Study -  CPAP -   Fasting Blood Sugar -  Checks Blood Sugar _____ times a day  Blood Thinner Instructions: Plavix,Aspirin. RN advised pt. To callfor instructions. Aspirin Instructions: Last Dose:  Anesthesia review: Hx: CAD,HTN  Patient denies shortness of breath, fever, cough and chest pain at PAT appointment   Patient verbalized understanding of instructions that were given to them at the PAT appointment. Patient was also instructed that they will need to review over the PAT instructions again at home before surgery.

## 2020-03-18 NOTE — Progress Notes (Addendum)
Anesthesia Chart Review   Case: [id number] Date/Time: 03/20/20 0945   Procedure: REVERSE SHOULDER ARTHROPLASTY (Right Shoulder) - 151min   Anesthesia type: General   Pre-op diagnosis: Right shoulder rotator cuff tear arthropathy   Location: [facility] ROOM 06 / [facility]   Surgeons: Justice Britain, MD      DISCUSSION:69 y.o. never smoker with h/o HTN, GERD, CAD (CABG 2018, DES 2019), right shoulder rotator cuff tear scheduled for above procedure 03/20/2020 with Dr. Justice Britain.   Last cardiology notes and stress test/echo results requested.  Clearance requested, pt with no instructions on when to hold Plavix at PAT visit on 03/17/2020.   Addendum:  Clearance received from cardiologist dated 02/07/20 which states, "This patient had a cardiac catheterization performed in October 2020 which shows this patient to be fully revascularized. This patient had an echocardiogram performed in September 2020 which showed EF 50-55%, normal pulmonary artery pressure, and no significant valvular disease. I feel this patient is low risk from a acardiac standpoint to proceed with this shoulder surgery. This patient may hold his clopidogrel 5 days prior to the procedure and resume 24 hours after the procedure. He should continue aspirin therapy through the perioperative period"  Spoke with patient who reports he was never given instructions on when to hold Plavix. He stopped yesterday on his own.  Last dose 03/16/2020.    VS: BP (!) 170/79   Pulse 72   Temp 36.7 C (Oral)   Ht 5\' 7"  (1.702 m)   Wt 94.3 kg   SpO2 100%   BMI 32.58 kg/m   PROVIDERS: Quentin Cornwall, MD is PCP   Donn Pierini, MD is Cardiologist  LABS: Labs reviewed: Acceptable for surgery. (all labs ordered are listed, but only abnormal results are displayed)  Labs Reviewed  BASIC METABOLIC PANEL - Abnormal; Notable for the following components:      Result Value   Glucose, Bld 108 (*)    All other components within normal limits  SURGICAL PCR  SCREEN  CBC  NO BLOOD PRODUCTS     IMAGES:   EKG: On chart  CV:  Past Medical History:  Diagnosis Date  . Arthritis   . CAD (coronary artery disease)   . Cancer ([place])    basal cell  . GERD (gastroesophageal reflux disease)   . Glaucoma    unsure if closed/open angle glaucoma  . Hyperlipidemia   . Hypertension   . Lyme disease    pt reports "chronic lyme" since the 1970s  . Parkinson disease [facility])     Past Surgical History:  Procedure Laterality Date  . BICEPS TENDON REPAIR Left   . COLONOSCOPY N/A 10/13/2015   Procedure: COLONOSCOPY;  Surgeon: Danie Binder, MD;  Location: [facility] SUITE;  Service: Endoscopy;  Laterality: N/A;  10:30 Am  . CORONARY ARTERY BYPASS GRAFT    . ORIF ORBITAL FRACTURE Right   . SPINAL FIXATION SURGERY W/ IMPLANT     times 5  . STENT PLACEMENT VASCULAR ([place] HX)  01/2018   11/2017  . tendon transplant Right 62months ago    MEDICATIONS: . Ascorbic Acid (VITAMIN C) 1000 MG tablet  . aspirin EC 81 MG tablet  . atorvastatin (LIPITOR) 80 MG tablet  . calcium carbonate (TUMS - DOSED IN MG ELEMENTAL CALCIUM) 500 MG chewable tablet  . carbidopa-levodopa (SINEMET IR) 25-100 MG tablet  . Carbidopa-Levodopa ER (SINEMET CR) 25-100 MG tablet controlled release  . Cholecalciferol (VITAMIN D3) 1.25 MG (50000 UT) TABS  .  clopidogrel (PLAVIX) 75 MG tablet  . Coenzyme Q10 (COQ10) 100 MG CAPS  . folic acid (FOLVITE) 1 MG tablet  . Ginger, Zingiber officinalis, (GINGER PO)  . GLUTATHIONE PO  . ketoconazole (NIZORAL) 2 % shampoo  . latanoprost (XALATAN) 0.005 % ophthalmic solution  . MAGNESIUM CITRATE PO  . nitroGLYCERIN (NITROSTAT) 0.4 MG SL tablet  . OVER THE COUNTER MEDICATION  . OVER THE COUNTER MEDICATION  . OVER THE COUNTER MEDICATION  . oxyCODONE-acetaminophen (PERCOCET/ROXICET) 5-325 MG tablet  . rotigotine (NEUPRO) 6 MG/24HR  . timolol (TIMOPTIC) 0.25 % ophthalmic solution  . triamcinolone (KENALOG) 0.025 % cream  . Zinc 50 MG CAPS    No current facility-administered medications for this encounter.    Konrad Felix, PA-C [facility] Pre-Surgical Testing [phone]

## 2020-03-19 NOTE — Anesthesia Preprocedure Evaluation (Addendum)
Anesthesia Evaluation  Patient identified by MRN, date of birth, ID band Patient awake    Reviewed: Allergy & Precautions, NPO status , Patient's Chart, lab work & pertinent test results  History of Anesthesia Complications Negative for: history of anesthetic complications  Airway Mallampati: III  TM Distance: >3 FB Neck ROM: Full    Dental  (+) Dental Advisory Given   Pulmonary neg pulmonary ROS, neg shortness of breath, neg sleep apnea, neg COPD, neg recent URI,  Covid-19 Nucleic Acid Test Results Lab Results      Component                Value               Date                      SARSCOV2NAA              NEGATIVE            03/17/2020              breath sounds clear to auscultation       Cardiovascular hypertension, (-) angina+ CAD and + CABG   Rhythm:Regular  Clearance received from cardiologist dated 02/07/20 which states, "This patient had a cardiac catheterization performed in October 2020 which shows this patient to be fully revascularized. This patient had an echocardiogram performed in September 2020 which showed EF 50-55%, normal pulmonary artery pressure, and no significant valvular disease. I feel this patient is low risk from a acardiac standpoint to proceed with this shoulder surgery. This patient may hold his clopidogrel 5 days prior to the procedure and resume 24 hours after the procedure. He should continue aspirin therapy through the perioperative period"   Neuro/Psych Parkinsons negative psych ROS   GI/Hepatic Neg liver ROS, GERD  Controlled,  Endo/Other  negative endocrine ROSNo results found for: HGBA1C   Renal/GU negative Renal ROSLab Results      Component                Value               Date                      CREATININE               0.76                03/17/2020                Musculoskeletal  (+) Arthritis ,  Right shoulder rotator cuff tear arthropathy   Abdominal   Peds   Hematology plavix held by patient  Lab Results      Component                Value               Date                      WBC                      6.9                 03/17/2020                HGB  14.5                03/17/2020                HCT                      44.4                03/17/2020                MCV                      95.5                03/17/2020                PLT                      212                 03/17/2020              Anesthesia Other Findings   Reproductive/Obstetrics                           Anesthesia Physical Anesthesia Plan  ASA: II  Anesthesia Plan: General and Regional   Post-op Pain Management:  Regional for Post-op pain   Induction: Intravenous  PONV Risk Score and Plan: 2 and Ondansetron and Dexamethasone  Airway Management Planned: Oral ETT  Additional Equipment: None  Intra-op Plan:   Post-operative Plan: Extubation in OR  Informed Consent: I have reviewed the patients History and Physical, chart, labs and discussed the procedure including the risks, benefits and alternatives for the proposed anesthesia with the patient or authorized representative who has indicated his/her understanding and acceptance.     Dental advisory given  Plan Discussed with: CRNA and Surgeon  Anesthesia Plan Comments: (See PAT note 03/17/2020, Konrad Felix, PA-C)       Anesthesia Quick Evaluation

## 2020-03-20 ENCOUNTER — Other Ambulatory Visit: Payer: Self-pay

## 2020-03-20 ENCOUNTER — Encounter (HOSPITAL_COMMUNITY): Payer: Self-pay | Admitting: Orthopedic Surgery

## 2020-03-20 ENCOUNTER — Ambulatory Visit (HOSPITAL_COMMUNITY)
Admission: RE | Admit: 2020-03-20 | Discharge: 2020-03-20 | Disposition: A | Discharge status: Home / Self Care | Payer: Medicare Other | Source: Ambulatory Visit | Attending: Orthopedic Surgery | Admitting: Orthopedic Surgery

## 2020-03-20 ENCOUNTER — Ambulatory Visit (HOSPITAL_COMMUNITY): Payer: Medicare Other | Admitting: Physician Assistant

## 2020-03-20 ENCOUNTER — Telehealth: Payer: Self-pay | Admitting: Neurology

## 2020-03-20 ENCOUNTER — Ambulatory Visit (HOSPITAL_COMMUNITY): Payer: Medicare Other | Admitting: Anesthesiology

## 2020-03-20 ENCOUNTER — Encounter (HOSPITAL_COMMUNITY)
Admission: RE | Disposition: A | Discharge status: Home / Self Care | Payer: Self-pay | Source: Ambulatory Visit | Attending: Orthopedic Surgery

## 2020-03-20 DIAGNOSIS — Z20822 Contact with and (suspected) exposure to covid-19: Secondary | ICD-10-CM | POA: Insufficient documentation

## 2020-03-20 DIAGNOSIS — Z7982 Long term (current) use of aspirin: Secondary | ICD-10-CM | POA: Insufficient documentation

## 2020-03-20 DIAGNOSIS — M19011 Primary osteoarthritis, right shoulder: Secondary | ICD-10-CM | POA: Diagnosis not present

## 2020-03-20 DIAGNOSIS — M75121 Complete rotator cuff tear or rupture of right shoulder, not specified as traumatic: Secondary | ICD-10-CM | POA: Diagnosis not present

## 2020-03-20 DIAGNOSIS — I10 Essential (primary) hypertension: Secondary | ICD-10-CM | POA: Diagnosis not present

## 2020-03-20 DIAGNOSIS — M75101 Unspecified rotator cuff tear or rupture of right shoulder, not specified as traumatic: Secondary | ICD-10-CM | POA: Diagnosis not present

## 2020-03-20 DIAGNOSIS — Z7902 Long term (current) use of antithrombotics/antiplatelets: Secondary | ICD-10-CM | POA: Diagnosis not present

## 2020-03-20 DIAGNOSIS — Z79899 Other long term (current) drug therapy: Secondary | ICD-10-CM | POA: Insufficient documentation

## 2020-03-20 DIAGNOSIS — I2510 Atherosclerotic heart disease of native coronary artery without angina pectoris: Secondary | ICD-10-CM | POA: Diagnosis not present

## 2020-03-20 DIAGNOSIS — M12811 Other specific arthropathies, not elsewhere classified, right shoulder: Secondary | ICD-10-CM | POA: Diagnosis not present

## 2020-03-20 DIAGNOSIS — G8918 Other acute postprocedural pain: Secondary | ICD-10-CM | POA: Diagnosis not present

## 2020-03-20 HISTORY — PX: REVERSE SHOULDER ARTHROPLASTY: SHX5054

## 2020-03-20 SURGERY — ARTHROPLASTY, SHOULDER, TOTAL, REVERSE
Anesthesia: Regional | Site: Shoulder | Laterality: Right

## 2020-03-20 MED ORDER — CEFAZOLIN SODIUM-DEXTROSE 2-4 GM/100ML-% IV SOLN
2.0000 g | INTRAVENOUS | Status: AC
  Administered 2020-03-20: 2 g via INTRAVENOUS
  Filled 2020-03-20: qty 100

## 2020-03-20 MED ORDER — ONDANSETRON HCL 4 MG/2ML IJ SOLN
INTRAMUSCULAR | Status: DC | PRN
  Administered 2020-03-20: 4 mg via INTRAVENOUS

## 2020-03-20 MED ORDER — NEUPRO 6 MG/24HR TD PT24: 1.0000 | MEDICATED_PATCH | Freq: Every day | TRANSDERMAL | 0 refills | Status: DC

## 2020-03-20 MED ORDER — SODIUM CHLORIDE 0.9 % IR SOLN
Status: DC | PRN
  Administered 2020-03-20: 1000 mL

## 2020-03-20 MED ORDER — ONDANSETRON HCL 4 MG/2ML IJ SOLN
INTRAMUSCULAR | Status: AC
  Filled 2020-03-20: qty 2

## 2020-03-20 MED ORDER — ACETAMINOPHEN 160 MG/5ML PO SOLN: 1000.0000 mg | Freq: Once | ORAL | Status: DC | PRN

## 2020-03-20 MED ORDER — STERILE WATER FOR IRRIGATION IR SOLN
Status: DC | PRN
  Administered 2020-03-20: 2000 mL

## 2020-03-20 MED ORDER — PROPOFOL 10 MG/ML IV BOLUS
INTRAVENOUS | Status: AC
  Filled 2020-03-20: qty 20

## 2020-03-20 MED ORDER — ACETAMINOPHEN 500 MG PO TABS: 1000.0000 mg | ORAL_TABLET | Freq: Once | ORAL | Status: DC | PRN

## 2020-03-20 MED ORDER — ORAL CARE MOUTH RINSE: 15.0000 mL | Freq: Once | OROMUCOSAL | Status: AC

## 2020-03-20 MED ORDER — METOCLOPRAMIDE HCL 5 MG/ML IJ SOLN
5.0000 mg | Freq: Three times a day (TID) | INTRAMUSCULAR | Status: DC | PRN
Start: 2020-03-20 — End: 2020-03-20

## 2020-03-20 MED ORDER — CHLORHEXIDINE GLUCONATE 0.12 % MT SOLN
15.0000 mL | Freq: Once | OROMUCOSAL | Status: AC
  Administered 2020-03-20: 15 mL via OROMUCOSAL

## 2020-03-20 MED ORDER — PROPOFOL 10 MG/ML IV BOLUS
INTRAVENOUS | Status: DC | PRN
  Administered 2020-03-20: 130 mg via INTRAVENOUS

## 2020-03-20 MED ORDER — SUGAMMADEX SODIUM 200 MG/2ML IV SOLN
INTRAVENOUS | Status: DC | PRN
  Administered 2020-03-20: 200 mg via INTRAVENOUS

## 2020-03-20 MED ORDER — DEXAMETHASONE SODIUM PHOSPHATE 10 MG/ML IJ SOLN
INTRAMUSCULAR | Status: AC
  Filled 2020-03-20: qty 1

## 2020-03-20 MED ORDER — OXYCODONE HCL 5 MG/5ML PO SOLN: 5.0000 mg | Freq: Once | ORAL | Status: DC | PRN

## 2020-03-20 MED ORDER — LIDOCAINE 2% (20 MG/ML) 5 ML SYRINGE
INTRAMUSCULAR | Status: AC
  Filled 2020-03-20: qty 5

## 2020-03-20 MED ORDER — FENTANYL CITRATE (PF) 100 MCG/2ML IJ SOLN
50.0000 ug | Freq: Once | INTRAMUSCULAR | Status: AC
  Administered 2020-03-20: 50 ug via INTRAVENOUS
  Filled 2020-03-20: qty 2

## 2020-03-20 MED ORDER — EPHEDRINE 5 MG/ML INJ
INTRAVENOUS | Status: AC
  Filled 2020-03-20: qty 10

## 2020-03-20 MED ORDER — OXYCODONE-ACETAMINOPHEN 5-325 MG PO TABS: 1.0000 | ORAL_TABLET | Freq: Three times a day (TID) | ORAL | 0 refills | Status: AC | PRN

## 2020-03-20 MED ORDER — METOCLOPRAMIDE HCL 5 MG PO TABS: 5.0000 mg | ORAL_TABLET | Freq: Three times a day (TID) | ORAL | Status: DC | PRN

## 2020-03-20 MED ORDER — LIDOCAINE 2% (20 MG/ML) 5 ML SYRINGE
INTRAMUSCULAR | Status: DC | PRN
  Administered 2020-03-20: 60 mg via INTRAVENOUS

## 2020-03-20 MED ORDER — FENTANYL CITRATE (PF) 100 MCG/2ML IJ SOLN: 25.0000 ug | INTRAMUSCULAR | Status: DC | PRN

## 2020-03-20 MED ORDER — MIDAZOLAM HCL 2 MG/2ML IJ SOLN
1.0000 mg | INTRAMUSCULAR | Status: AC
  Administered 2020-03-20: 1 mg via INTRAVENOUS
  Filled 2020-03-20: qty 2

## 2020-03-20 MED ORDER — CARBIDOPA-LEVODOPA ER 25-100 MG PO TBCR: EXTENDED_RELEASE_TABLET | ORAL | 0 refills | Status: DC

## 2020-03-20 MED ORDER — LACTATED RINGERS IV BOLUS
500.0000 mL | Freq: Once | INTRAVENOUS | Status: AC
  Administered 2020-03-20: 500 mL via INTRAVENOUS

## 2020-03-20 MED ORDER — BUPIVACAINE HCL (PF) 0.5 % IJ SOLN
INTRAMUSCULAR | Status: DC | PRN
  Administered 2020-03-20: 15 mL via PERINEURAL

## 2020-03-20 MED ORDER — LACTATED RINGERS IV BOLUS
250.0000 mL | Freq: Once | INTRAVENOUS | Status: AC
  Administered 2020-03-20: 250 mL via INTRAVENOUS

## 2020-03-20 MED ORDER — ONDANSETRON HCL 4 MG PO TABS: 4.0000 mg | ORAL_TABLET | Freq: Three times a day (TID) | ORAL | 0 refills | Status: DC | PRN

## 2020-03-20 MED ORDER — ONDANSETRON HCL 4 MG PO TABS
4.0000 mg | ORAL_TABLET | Freq: Four times a day (QID) | ORAL | Status: DC | PRN
  Filled 2020-03-20: qty 1

## 2020-03-20 MED ORDER — FENTANYL CITRATE (PF) 100 MCG/2ML IJ SOLN
INTRAMUSCULAR | Status: AC
  Filled 2020-03-20: qty 2

## 2020-03-20 MED ORDER — ROCURONIUM BROMIDE 10 MG/ML (PF) SYRINGE
PREFILLED_SYRINGE | INTRAVENOUS | Status: AC
  Filled 2020-03-20: qty 10

## 2020-03-20 MED ORDER — TRANEXAMIC ACID-NACL 1000-0.7 MG/100ML-% IV SOLN
1000.0000 mg | INTRAVENOUS | Status: AC
  Administered 2020-03-20: 1000 mg via INTRAVENOUS
  Filled 2020-03-20: qty 100

## 2020-03-20 MED ORDER — LACTATED RINGERS IV SOLN: INTRAVENOUS | Status: DC

## 2020-03-20 MED ORDER — ONDANSETRON HCL 4 MG/2ML IJ SOLN: 4.0000 mg | Freq: Four times a day (QID) | INTRAMUSCULAR | Status: DC | PRN

## 2020-03-20 MED ORDER — ROCURONIUM BROMIDE 10 MG/ML (PF) SYRINGE
PREFILLED_SYRINGE | INTRAVENOUS | Status: DC | PRN
  Administered 2020-03-20: 10 mg via INTRAVENOUS
  Administered 2020-03-20: 50 mg via INTRAVENOUS

## 2020-03-20 MED ORDER — BUPIVACAINE LIPOSOME 1.3 % IJ SUSP
INTRAMUSCULAR | Status: DC | PRN
  Administered 2020-03-20: 133 mg via PERINEURAL

## 2020-03-20 MED ORDER — DEXAMETHASONE SODIUM PHOSPHATE 10 MG/ML IJ SOLN
INTRAMUSCULAR | Status: DC | PRN
  Administered 2020-03-20: 10 mg via INTRAVENOUS

## 2020-03-20 MED ORDER — VANCOMYCIN HCL 1000 MG IV SOLR
INTRAVENOUS | Status: DC | PRN
  Administered 2020-03-20: 1000 mg

## 2020-03-20 MED ORDER — EPHEDRINE SULFATE-NACL 50-0.9 MG/10ML-% IV SOSY
PREFILLED_SYRINGE | INTRAVENOUS | Status: DC | PRN
  Administered 2020-03-20: 5 mg via INTRAVENOUS
  Administered 2020-03-20: 15 mg via INTRAVENOUS
  Administered 2020-03-20: 5 mg via INTRAVENOUS
  Administered 2020-03-20: 10 mg via INTRAVENOUS
  Administered 2020-03-20: 5 mg via INTRAVENOUS

## 2020-03-20 MED ORDER — ACETAMINOPHEN 10 MG/ML IV SOLN: 1000.0000 mg | Freq: Once | INTRAVENOUS | Status: DC | PRN

## 2020-03-20 MED ORDER — OXYCODONE HCL 5 MG PO TABS: 5.0000 mg | ORAL_TABLET | Freq: Once | ORAL | Status: DC | PRN

## 2020-03-20 MED ORDER — VANCOMYCIN HCL 1000 MG IV SOLR
INTRAVENOUS | Status: AC
  Filled 2020-03-20: qty 1000

## 2020-03-20 SURGICAL SUPPLY — 65 items
BAG ZIPLOCK 12X15 (MISCELLANEOUS) ×2 IMPLANT
BLADE SAW SGTL 83.5X18.5 (BLADE) ×2 IMPLANT
COOLER ICEMAN CLASSIC (MISCELLANEOUS) ×2 IMPLANT
COVER BACK TABLE 60X90IN (DRAPES) ×2 IMPLANT
COVER SURGICAL LIGHT HANDLE (MISCELLANEOUS) ×2 IMPLANT
COVER WAND RF STERILE (DRAPES) IMPLANT
CUP SUT UNIV REVERS 39 NEU (Shoulder) ×2 IMPLANT
DERMABOND ADVANCED (GAUZE/BANDAGES/DRESSINGS) ×1
DERMABOND ADVANCED .7 DNX12 (GAUZE/BANDAGES/DRESSINGS) ×1 IMPLANT
DRAPE INCISE IOBAN 66X45 STRL (DRAPES) IMPLANT
DRAPE ORTHO SPLIT 77X108 STRL (DRAPES) ×2
DRAPE SHEET LG 3/4 BI-LAMINATE (DRAPES) ×2 IMPLANT
DRAPE SURG 17X11 SM STRL (DRAPES) ×2 IMPLANT
DRAPE SURG ORHT 6 SPLT 77X108 (DRAPES) ×2 IMPLANT
DRAPE U-SHAPE 47X51 STRL (DRAPES) ×2 IMPLANT
DRSG AQUACEL AG ADV 3.5X10 (GAUZE/BANDAGES/DRESSINGS) ×2 IMPLANT
DURAPREP 26ML APPLICATOR (WOUND CARE) ×2 IMPLANT
ELECT BLADE TIP CTD 4 INCH (ELECTRODE) ×2 IMPLANT
ELECT REM PT RETURN 15FT ADLT (MISCELLANEOUS) ×2 IMPLANT
FACESHIELD WRAPAROUND (MASK) ×8 IMPLANT
GLENOID UNI REV MOD 24 +2 LAT (Joint) ×2 IMPLANT
GLENOSPHERE 39+4 LAT/24 UNI RV (Joint) ×2 IMPLANT
GLOVE SS BIOGEL STRL SZ 7.5 (GLOVE) ×1 IMPLANT
GLOVE SUPERSENSE BIOGEL SZ 7.5 (GLOVE) ×1
GLOVE SURG ENC MOIS LTX SZ7.5 (GLOVE) ×2 IMPLANT
GLOVE SURG ENC MOIS LTX SZ8 (GLOVE) ×2 IMPLANT
GLOVE SURG MICRO LTX SZ7 (GLOVE) ×2 IMPLANT
GLOVE SURG SYN 7.0 (GLOVE) IMPLANT
GLOVE SURG SYN 7.5  E (GLOVE)
GLOVE SURG SYN 7.5 E (GLOVE) IMPLANT
GLOVE SURG SYN 8.0 (GLOVE) IMPLANT
GOWN STRL REUS W/TWL LRG LVL3 (GOWN DISPOSABLE) ×4 IMPLANT
INSERT HUMERAL 39/+6 (Insert) ×2 IMPLANT
KIT BASIN OR (CUSTOM PROCEDURE TRAY) ×2 IMPLANT
KIT TURNOVER KIT A (KITS) ×2 IMPLANT
MANIFOLD NEPTUNE II (INSTRUMENTS) ×2 IMPLANT
NEEDLE TAPERED W/ NITINOL LOOP (MISCELLANEOUS) ×2 IMPLANT
NS IRRIG 1000ML POUR BTL (IV SOLUTION) ×2 IMPLANT
PACK SHOULDER (CUSTOM PROCEDURE TRAY) ×2 IMPLANT
PAD ARMBOARD 7.5X6 YLW CONV (MISCELLANEOUS) ×2 IMPLANT
PAD COLD SHLDR WRAP-ON (PAD) ×2 IMPLANT
PIN NITINOL TARGETER 2.8 (PIN) IMPLANT
PIN SET MODULAR GLENOID SYSTEM (PIN) ×2 IMPLANT
RESTRAINT HEAD UNIVERSAL NS (MISCELLANEOUS) ×2 IMPLANT
SCREW CENTRAL MOD 35 (Screw) ×2 IMPLANT
SCREW PERI LOCK 5.5X16 (Screw) ×2 IMPLANT
SCREW PERI LOCK 5.5X24 (Screw) ×2 IMPLANT
SCREW PERI LOCK 5.5X32 (Screw) ×2 IMPLANT
SCREW PERIPHERAL 5.5X20 LOCK (Screw) ×2 IMPLANT
SLING ARM FOAM STRAP LRG (SOFTGOODS) ×4 IMPLANT
SLING ARM FOAM STRAP MED (SOFTGOODS) IMPLANT
SPONGE LAP 18X18 RF (DISPOSABLE) IMPLANT
STEM HUMERAL UNI REV CAP SZ13 (Stem) ×2 IMPLANT
SUCTION FRAZIER HANDLE 12FR (TUBING) ×1
SUCTION TUBE FRAZIER 12FR DISP (TUBING) ×1 IMPLANT
SUT FIBERWIRE #2 38 T-5 BLUE (SUTURE)
SUT MNCRL AB 3-0 PS2 18 (SUTURE) ×2 IMPLANT
SUT MON AB 2-0 CT1 36 (SUTURE) ×2 IMPLANT
SUT VIC AB 1 CT1 36 (SUTURE) ×2 IMPLANT
SUTURE FIBERWR #2 38 T-5 BLUE (SUTURE) IMPLANT
SUTURE TAPE 1.3 40 TPR END (SUTURE) ×2 IMPLANT
SUTURETAPE 1.3 40 TPR END (SUTURE) ×4
TOWEL OR 17X26 10 PK STRL BLUE (TOWEL DISPOSABLE) ×2 IMPLANT
TOWEL OR NON WOVEN STRL DISP B (DISPOSABLE) ×2 IMPLANT
WATER STERILE IRR 1000ML POUR (IV SOLUTION) ×4 IMPLANT

## 2020-03-20 NOTE — Anesthesia Procedure Notes (Signed)
Procedure Name: Intubation Date/Time: 03/20/2020 10:23 AM Performed by: [place] Lelani Garnett C, CRNA Pre-anesthesia Checklist: Patient identified, Emergency Drugs available, Suction available and Patient being monitored Patient Re-evaluated:Patient Re-evaluated prior to induction Oxygen Delivery Method: Circle system utilized Preoxygenation: Pre-oxygenation with 100% oxygen Induction Type: IV induction Ventilation: Mask ventilation without difficulty Tube type: Oral Number of attempts: 1 Airway Equipment and Method: Stylet and Oral airway Placement Confirmation: ETT inserted through vocal cords under direct vision,  positive ETCO2 and breath sounds checked- equal and bilateral Tube secured with: Tape Dental Injury: Teeth and Oropharynx as per pre-operative assessment

## 2020-03-20 NOTE — Telephone Encounter (Signed)
Refill called in. 

## 2020-03-20 NOTE — H&P (Signed)
Brett Patterson    Chief Complaint: Right shoulder rotator cuff tear arthropathy HPI: The patient is a 70 y.o. male with chronic and progressively increasing right shoulder pain related to significant osteoarthritis as well as a recurrent full-thickness rotator cuff tear.  Due to his increasing pain and functional imitations he is brought to the operating this time for planned right shoulder reverse arthroplasty  Patient has the additional underlying medical comorbidities which include chronic pain, currently in pain management, taking anywhere from 1-4 oxycodone 5/325 daily.  Additionally the patient has known Parkinson's disease and is currently followed by the local neurology group and does take a medication regimen for that condition.  Past Medical History:  Diagnosis Date  . Arthritis   . CAD (coronary artery disease)   . Cancer ([place])    basal cell  . GERD (gastroesophageal reflux disease)   . Glaucoma    unsure if closed/open angle glaucoma  . Hyperlipidemia   . Hypertension   . Lyme disease    pt reports "chronic lyme" since the 1970s  . Parkinson disease [facility])     Past Surgical History:  Procedure Laterality Date  . BICEPS TENDON REPAIR Left   . COLONOSCOPY N/A 10/13/2015   Procedure: COLONOSCOPY;  Surgeon: Danie Binder, MD;  Location: [facility];  Service: Endoscopy;  Laterality: N/A;  10:30 Am  . CORONARY ARTERY BYPASS GRAFT    . ORIF ORBITAL FRACTURE Right   . SPINAL FIXATION SURGERY W/ IMPLANT     times 5  . STENT PLACEMENT VASCULAR ([place] [facility])  01/2018   11/2017  . tendon transplant Right 28months ago    Family History  Problem Relation Age of Onset  . Alzheimer's disease Mother   . Pancreatic cancer Father   . Melanoma Sister   . Healthy Brother   . Healthy Child     Social History:  reports that he has never smoked. He has never used smokeless tobacco. He reports previous alcohol use. He reports that he does not use drugs.   Medications Prior to  Admission  Medication Sig Dispense Refill  . Ascorbic Acid (VITAMIN C) 1000 MG tablet Take 1,000 mg by mouth daily.    Marland Kitchen aspirin EC 81 MG tablet Take 81 mg by mouth daily.    Marland Kitchen atorvastatin (LIPITOR) 80 MG tablet Take 80 mg by mouth daily.    . calcium carbonate (TUMS - DOSED IN MG ELEMENTAL CALCIUM) 500 MG chewable tablet Chew 1,000-1,500 mg by mouth daily as needed for indigestion or heartburn.    . carbidopa-levodopa (SINEMET IR) 25-100 MG tablet Take 2 tablets by mouth 5 (five) times daily. 900 tablet 0  . clopidogrel (PLAVIX) 75 MG tablet Take 75 mg by mouth daily.     . Ginger, Zingiber officinalis, (GINGER PO) Take 7 drops by mouth 3 (three) times daily. Liquid ginger supplement    . GLUTATHIONE PO Take 1 tablet by mouth daily.    Marland Kitchen ketoconazole (NIZORAL) 2 % shampoo Apply 1 application topically daily as needed for irritation.    Marland Kitchen latanoprost (XALATAN) 0.005 % ophthalmic solution Place 1 drop into both eyes at bedtime.    Marland Kitchen MAGNESIUM CITRATE PO Take 1 tablet by mouth daily.    Marland Kitchen OVER THE COUNTER MEDICATION Take 1 capsule by mouth 2 (two) times daily. nerve renew otc supplement    . OVER THE COUNTER MEDICATION Take 1 tablet by mouth daily. Livit-2    . OVER THE COUNTER MEDICATION Take 1  Dose by mouth in the morning and at bedtime. Para-A liquid supplement    . oxyCODONE-acetaminophen (PERCOCET/ROXICET) 5-325 MG tablet Take 1 tablet by mouth every 8 (eight) hours as needed for moderate pain.    Marland Kitchen timolol (TIMOPTIC) 0.25 % ophthalmic solution Place 1 drop into both eyes daily.    Marland Kitchen triamcinolone (KENALOG) 0.025 % cream Apply 1 application topically 2 (two) times daily as needed for irritation.    . Zinc 50 MG CAPS Take 50 mg by mouth daily.    . Cholecalciferol (VITAMIN D3) 1.25 MG (50000 UT) TABS Take 50,000 Units by mouth every Monday. With a probiotic    . Coenzyme Q10 (COQ10) 100 MG CAPS Take 100 mg by mouth daily.    . folic acid (FOLVITE) 1 MG tablet Take 1 mg by mouth daily.    .  nitroGLYCERIN (NITROSTAT) 0.4 MG SL tablet Place 0.4 mg under the tongue every 5 (five) minutes as needed for chest pain.  3     Physical Exam: Right shoulder demonstrates painful and guarded motion as noted at his recent office visit.  Elevation is significantly limited.  He has profoundly restricted mobility as well as strength related to his chronic rotator cuff tear and underlying arthritis.  He is otherwise grossly neurovascular intact.  He has a well-healed anterior lateral deltoid splitting incision from his previous shoulder surgery last year.  Plain radiographs confirm moderate glenohumeral arthritis.  MRI scan confirms a large recurrent rotator cuff tear and changes consistent with rotator cuff tear arthropathy  Vitals  Temp:  [98 F (36.7 C)] 98 F (36.7 C) (03/17 0756) Pulse Rate:  [66-75] 67 (03/17 0857) Resp:  [0-21] 17 (03/17 0857) BP: (130-150)/(62-82) 150/62 (03/17 0857) SpO2:  [91 %-98 %] 94 % (03/17 0857) Weight:  [94.3 kg] 94.3 kg (03/17 0753)  Assessment/Plan  Impression: Right shoulder rotator cuff tear arthropathy  Plan of Action: Procedure(s): REVERSE SHOULDER ARTHROPLASTY  Abigaelle Verley M Verl Kitson 03/20/2020, 9:37 AM Contact # [phone]

## 2020-03-20 NOTE — Op Note (Signed)
03/20/2020  11:51 AM  PATIENT:   Brett Patterson  70 y.o. male  PRE-OPERATIVE DIAGNOSIS:  Right shoulder rotator cuff tear arthropathy  POST-OPERATIVE DIAGNOSIS: Same  PROCEDURE: Right shoulder reverse arthroplasty utilizing a size 13 Arthrex stem with a neutral metaphysis, +6 polyethylene spacer, 39/+4 glenosphere on a small/+2 baseplate  SURGEON:  Anupama Piehl, Metta Clines M.D.  ASSISTANTS: Jenetta Loges, PA-C  ANESTHESIA:   General endotracheal and interscalene block with Exparel  EBL: 150 cc  SPECIMEN: None  Drains: None   PATIENT DISPOSITION:  PACU - hemodynamically stable.    PLAN OF CARE: Discharge to home after PACU  Brief history:  Patient is a 70 year old male who has had chronic and progressively increasing right shoulder pain related to severe osteoarthritis as well as a chronic recurrent rotator cuff tear.  Given his increasing functional imitations and failure to respond to prolonged attempts at conservative management he is brought to the operating at this time for planned right shoulder reverse arthroplasty.  Preoperatively, I counseled the patient regarding treatment options and risks versus benefits thereof.  Possible surgical complications were all reviewed including potential for bleeding, infection, neurovascular injury, persistent pain, loss of motion, anesthetic complication, failure of the implant, and possible need for additional surgery. They understand and accept and agrees with our planned procedure.   Procedure detail:  After undergoing routine preop evaluation the patient received prophylactic antibiotics and interscalene block with Exparel was established in the holding area by the anesthesia department.  The patient was subsequently placed supine on the operating table and underwent the smooth induction of a general endotracheal anesthesia.  Placed into the beachchair position and appropriately padded and protected.  The right shoulder girdle region was  sterilely prepped and draped in standard fashion.  Timeout was called.  A deltopectoral approach to the right shoulder is made through a 10 cm incision.  Skin flaps were elevated dissection carried deeply the deltopectoral interval was developed from proximal to distal with the vein taken laterally.  The upper centimeter and a half of the pectoralis major tendon was tenotomized for exposure and the conjoined tendon was mobilized and retracted medially.  The long head biceps tendon was then tenodesed at the upper border the pectoralis major tendon with proximal segment unroofed and excised.  The remnant of the rotator cuff superiorly was split from the apex of the bicipital groove to the base of the coracoid and the subscapularis was then separated from the lesser tuberosity insertion using electrocautery and the free margin was then tagged with a pair of suture tape sutures.  The subscap was reflected medially and capsular attachments on the anterior and infra margins of the humeral neck were then divided and subperiosteal fashion delivering the humeral head through the wound.  Complete absence of the rotator cuff superiorly was noted.  Extra medullary guide was used to outline a proposed humeral head resection which was then completed with an oscillating saw and peripheral osteophytes were then removed with rondure.  A metal cap was placed over the cut proximal humeral surface and we then exposed the glenoid with appropriate retractors.  A circumferential labral resection was completed.  Guidepin was then directed into the center of the glenoid with an approximately 10 degree inferior tilt and the glenoid was then reamed to stable subchondral bony bed with both the central and peripheral reamer.  All debris was then removed.  Glenoid preparation was completed with the central drill and tap and our baseplate was assembled and inserted  with vancomycin powder applied to the threads.  Excellent purchase was achieved.   The peripheral locking screws were all then placed using standard technique with excellent purchase and fixation.  A 39/+4 glenosphere was then impacted onto the baseplate and the central locking screw was placed.  We returned our attention back to the proximal humerus where the canal was opened and reaming to a size 7 and ultimately broaching up to a size 13.  We remove the retained suture anchors from the metaphysis and then completed broaching and then prepared the metaphysis with the metaphyseal reamer.  A trial reduction was then performed showing good motion good fit good soft tissue balance.  At this point the trial was removed.  The final implant was assembled on the back table.  Vancomycin powder was spread liberally into the humeral canal and the implant was then seated with excellent fit and fixation.  A series of trial reductions at this point showed the best soft tissue balance with a +6 polyshowed good motion good stability good soft tissue balance.  The trial polywas removed the implant was cleaned and dried and the final +6 polywas impacted.  Final reduction was performed with excellent soft tissue balance motion and stability much to our satisfaction.  The wound was then copious irrigated.  The subscapularis was confirmed to be mobilized and was repaired back to the eyelets on the collar of the stem using the previously placed suture tape sutures.  The balance of the vancomycin powder was then placed liberally to the soft tissue planes.  The deltopectoral interval was reapproximated with a series of figure-of-eight and 1 Vicryl sutures.  2-0 Monocryl used for subcu layer and intracircular 3-0 Monocryl for the skin followed by Dermabond and Aquacel dressing.  The right arm is in place with a sling and the patient was then awakened, extubated, and taken to the recovery room in stable condition.  Jenetta Loges, PA-C was utilized as an Environmental consultant throughout this case, essential for help with  positioning the patient, positioning extremity, tissue manipulation, implantation of the prosthesis, suture management, wound closure, and intraoperative decision-making.  Brett Shutter MD   Contact # [phone]

## 2020-03-20 NOTE — Discharge Instructions (Signed)
 Kevin M. Supple, M.D., F.A.A.O.S. Orthopaedic Surgery Specializing in Arthroscopic and Reconstructive Surgery of the Shoulder [phone] [address] - [place], [place] [address] - Fax [phone]   POST-OP TOTAL SHOULDER REPLACEMENT INSTRUCTIONS  1. Follow up in the office for your first post-op appointment 10-14 days from the date of your surgery. If you do not already have a scheduled appointment, our office will contact you to schedule.  2. The bandage over your incision is waterproof. You may begin showering with this dressing on. You may leave this dressing on until first follow up appointment within 2 weeks. We prefer you leave this dressing in place until follow up however after 5-7 days if you are having itching or skin irritation and would like to remove it you may do so. Go slow and tug at the borders gently to break the bond the dressing has with the skin. At this point if there is no drainage it is okay to go without a bandage or you may cover it with a light guaze and tape. You can also expect significant bruising around your shoulder that will drift down your arm and into your chest wall. This is very normal and should resolve over several days.   3. Wear your sling/immobilizer at all times except to perform the exercises below or to occasionally let your arm dangle by your side to stretch your elbow. You also need to sleep in your sling immobilizer until instructed otherwise. It is ok to remove your sling if you are sitting in a controlled environment and allow your arm to rest in a position of comfort by your side or on your lap with pillows to give your neck and skin a break from the sling. You may remove it to allow arm to dangle by side to shower. If you are up walking around and when you go to sleep at night you need to wear it.  4. Range of motion to your elbow, wrist, and hand are encouraged 3-5 times daily. Exercise to your hand and fingers helps to reduce  swelling you may experience.   5. Prescriptions for a pain medication and a muscle relaxant are provided for you. It is recommended that if you are experiencing pain that you pain medication alone is not controlling, add the muscle relaxant along with the pain medication which can give additional pain relief. The first 1-2 days is generally the most severe of your pain and then should gradually decrease. As your pain lessens it is recommended that you decrease your use of the pain medications to an "as needed basis'" only and to always comply with the recommended dosages of the pain medications.  6. Pain medications can produce constipation along with their use. If you experience this, the use of an over the counter stool softener or laxative daily is recommended.   7. For additional questions or concerns, please do not hesitate to call the office. If after hours there is an answering service to forward your concerns to the physician on call.  8.Pain control following an exparel block  To help control your post-operative pain you received a nerve block  performed with Exparel which is a long acting anesthetic (numbing agent) which can provide pain relief and sensations of numbness (and relief of pain) in the operative shoulder and arm for up to 3 days. Sometimes it provides mixed relief, meaning you may still have numbness in certain areas of the arm but can still be able to   move  parts of that arm, hand, and fingers. We recommend that your prescribed pain medications  be used as needed. We do not feel it is necessary to "pre medicate" and "stay ahead" of pain.  Taking narcotic pain medications when you are not having any pain can lead to unnecessary and potentially dangerous side effects.    9. Use the ice machine as much as possible in the first 5-7 days from surgery, then you can wean its use to as needed. The ice typically needs to be replaced every 6 hours, instead of ice you can actually freeze  water bottles to put in the cooler and then fill water around them to avoid having to purchase ice. You can have spare water bottles freezing to allow you to rotate them once they have melted. Try to have a thin shirt or light cloth or towel under the ice wrap to protect your skin.   FOR ADDITIONAL INFO ON ICE MACHINE AND INSTRUCTIONS GO TO THE WEBSITE AT  https://www.[employer].com/products/donjoy/donjoy-iceman-[email or web address]  10.  We recommend that you avoid any dental work or cleaning in the first 3 months following your joint replacement. This is to help minimize the possibility of infection from the bacteria in your mouth that enters your bloodstream during dental work. We also recommend that you take an antibiotic prior to your dental work for the first year after your shoulder replacement to further help reduce that risk. Please simply contact our office for antibiotics to be sent to your pharmacy prior to dental work.  11. Dental Antibiotics:  In most cases prophylactic antibiotics for Dental procdeures after total joint surgery are not necessary.  Exceptions are as follows:  1. History of prior total joint infection  2. Severely immunocompromised (Organ Transplant, cancer chemotherapy, Rheumatoid biologic meds such as Humera)  3. Poorly controlled diabetes (A1C &gt; 8.0, blood glucose over 200)  If you have one of these conditions, contact your surgeon for an antibiotic prescription, prior to your dental procedure.   POST-OP EXERCISES  Pendulum Exercises  Perform pendulum exercises while standing and bending at the waist. Support your uninvolved arm on a table or chair and allow your operated arm to hang freely. Make sure to do these exercises passively - not using you shoulder muscles. These exercises can be performed once your nerve block effects have worn off.  Repeat 20 times. Do 3 sessions per day.     

## 2020-03-20 NOTE — Anesthesia Procedure Notes (Signed)
Anesthesia Regional Block: Interscalene brachial plexus block   Pre-Anesthetic Checklist: ,, timeout performed, Correct Patient, Correct Site, Correct Laterality, Correct Procedure, Correct Position, site marked, Risks and benefits discussed,  Surgical consent,  Pre-op evaluation,  At surgeon's request and post-op pain management  Laterality: Right and Upper  Prep: chloraprep       Needles:  Injection technique: Single-shot     Needle Length: 5cm  Needle Gauge: 22     Additional Needles: Arrow StimuQuik ECHO Echogenic Stimulating PNB Needle  Procedures:,,,, ultrasound used (permanent image in chart),,,,  Narrative:  Start time: 03/20/2020 7:51 AM End time: 03/20/2020 7:56 AM Injection made incrementally with aspirations every 5 mL.  Performed by: Personally  Anesthesiologist: Oleta Mouse, MD

## 2020-03-20 NOTE — Transfer of Care (Signed)
Immediate Anesthesia Transfer of Care Note  Patient: Brett Patterson  Procedure(s) Performed: REVERSE SHOULDER ARTHROPLASTY (Right Shoulder)  Patient Location: PACU  Anesthesia Type:GA combined with regional for post-op pain  Level of Consciousness: awake, alert  and oriented  Airway & Oxygen Therapy: Patient Spontanous Breathing and Patient connected to face mask oxygen  Post-op Assessment: Report given to RN and Post -op Vital signs reviewed and stable  Post vital signs: Reviewed and stable  Last Vitals:  Vitals Value Taken Time  BP 158/69 03/20/20 1200  Temp    Pulse 78 03/20/20 1202  Resp 13 03/20/20 1202  SpO2 100 % 03/20/20 1202  Vitals shown include unvalidated device data.  Last Pain:  Vitals:   03/20/20 0753  PainSc: 3       Patients Stated Pain Goal: 2 (91/44/45 8483)  Complications: No complications documented.

## 2020-03-20 NOTE — Progress Notes (Signed)
AssistedDr. Moser with right, ultrasound guided, interscalene  block. Side rails up, monitors on throughout procedure. See vital signs in flow sheet. Tolerated Procedure well.  

## 2020-03-20 NOTE — Evaluation (Signed)
Occupational Therapy Evaluation Patient Details Name: Brett Patterson MRN: [id number] DOB: October 20, 1950 Today's Date: 03/20/2020    History of Present Illness Patient is a 70 year old male admitted 3/17 for R reverse total shoulder arthroplasty.   Clinical Impression   s/p shoulder replacement without functional use of right dominant upper extremity secondary to effects of surgery and interscalene block and shoulder precautions. Therapist provided education and instruction to patient and spouse in regards to exercises, precautions, positioning, donning upper extremity clothing and bathing while maintaining shoulder precautions, ice and edema management and donning/doffing sling. Patient and spouse verbalized understanding and demonstrated as needed. Patient needed assistance to donn shirt, underwear, pants, socks and shoes and provided with instruction on compensatory strategies to perform ADLs. Patient to follow up with MD for further therapy needs.     Follow Up Recommendations  Follow surgeon's recommendation for DC plan and follow-up therapies    Equipment Recommendations  None recommended by OT       Precautions / Restrictions Precautions Precautions: Shoulder Type of Shoulder Precautions: If sitting in controlled environment, ok to come out of sling to give neck a break. Please sleep in it to protect until follow up in office.     OK to use operative arm for feeding, hygiene and ADLs. Ok to instruct Pendulums and lap slides as exercises. Ok to use operative arm within the following parameters for ADL purposes New ROM Ok for PROM, AAROM, AROM within pain tolerance and within the following ROM   ER 20   ABD 45   FE 60 Shoulder Interventions: Shoulder sling/immobilizer;Off for dressing/bathing/exercises Precaution Booklet Issued: Yes (comment) Required Braces or Orthoses: Sling Restrictions Weight Bearing Restrictions: Yes RUE Weight Bearing: Non weight bearing      Mobility Bed  Mobility               General bed mobility comments: in chair    Transfers Overall transfer level: Modified independent Equipment used: None                  Balance Overall balance assessment: No apparent balance deficits (not formally assessed)                                         ADL either performed or assessed with clinical judgement   ADL Overall ADL's : Needs assistance/impaired     Grooming: Modified independent   Upper Body Bathing: Minimal assistance   Lower Body Bathing: Minimal assistance   Upper Body Dressing : Moderate assistance;Sitting;Cueing for compensatory techniques Upper Body Dressing Details (indicate cue type and reason): demo to patient spouse how to assist threading R UE, spouse then assisted patient with threading L UE Lower Body Dressing: Moderate assistance;Sit to/from stand;Sitting/lateral leans Lower Body Dressing Details (indicate cue type and reason): spouse assisting patient Toilet Transfer: Modified Independent   Toileting- Clothing Manipulation and Hygiene: Modified independent       Functional mobility during ADLs: Modified independent                    Pertinent Vitals/Pain Pain Assessment: Faces Faces Pain Scale: Hurts a little bit Pain Location: R shoulder Pain Descriptors / Indicators: Numbness Pain Intervention(s): Monitored during session     Hand Dominance Right   Extremity/Trunk Assessment Upper Extremity Assessment Upper Extremity Assessment: RUE deficits/detail RUE Deficits / Details: + nerve block   Lower  Extremity Assessment Lower Extremity Assessment: Overall WFL for tasks assessed   Cervical / Trunk Assessment Cervical / Trunk Assessment: Normal   Communication Communication Communication: No difficulties   Cognition Arousal/Alertness: Awake/alert Behavior During Therapy: WFL for tasks assessed/performed Overall Cognitive Status: Within Functional Limits for tasks  assessed                                        Exercises Exercises: Shoulder   Shoulder Instructions Shoulder Instructions Donning/doffing shirt without moving shoulder: Moderate assistance;Patient able to independently direct caregiver;Caregiver independent with task Method for sponge bathing under operated UE: Minimal assistance;Caregiver independent with task;Patient able to independently direct caregiver Donning/doffing sling/immobilizer: Moderate assistance;Caregiver independent with task;Patient able to independently direct caregiver Correct positioning of sling/immobilizer: Caregiver independent with task;Patient able to independently direct caregiver Pendulum exercises (written home exercise program): Patient able to independently direct caregiver ROM for elbow, wrist and digits of operated UE: Patient able to independently direct caregiver Sling wearing schedule (on at all times/off for ADL's): Patient able to independently direct caregiver;Caregiver independent with task Proper positioning of operated UE when showering: Patient able to independently direct caregiver;Caregiver independent with task Positioning of UE while sleeping: Patient able to independently direct caregiver;Caregiver independent with task    Home Living Family/patient expects to be discharged to:: Private residence Living Arrangements: Spouse/significant other;Children Available Help at Discharge: Family Type of Home: House Home Access: Stairs to enter Technical brewer of Steps: 3   Home Layout: Multi-level;Able to live on main level with bedroom/bathroom     Bathroom Shower/Tub: Occupational psychologist: Standard     Home Equipment: None          Prior Functioning/Environment Level of Independence: Independent                 OT Problem List: Pain;Impaired UE functional use         OT Goals(Current goals can be found in the care plan section) Acute Rehab  OT Goals Patient Stated Goal: home OT Goal Formulation: All assessment and education complete, DC therapy   AM-PAC OT "6 Clicks" Daily Activity     Outcome Measure Help from another person eating meals?: None Help from another person taking care of personal grooming?: None Help from another person toileting, which includes using toliet, bedpan, or urinal?: None Help from another person bathing (including washing, rinsing, drying)?: A Little Help from another person to put on and taking off regular upper body clothing?: A Little Help from another person to put on and taking off regular lower body clothing?: A Little 6 Click Score: 21   End of Session Equipment Utilized During Treatment: Other (comment) (sling) Nurse Communication: Mobility status  Activity Tolerance: Patient tolerated treatment well Patient left: in chair;with call bell/phone within reach;with family/visitor present  OT Visit Diagnosis: Pain Pain - Right/Left: Right Pain - part of body: Shoulder                Time: 4235-3614 OT Time Calculation (min): 30 min Charges:  OT General Charges $OT Visit: 1 Visit OT Evaluation $OT Eval Low Complexity: 1 Low OT Treatments $Self Care/Home Management : 8-22 mins  Delbert Phenix OT OT pager: [phone]  Rosemary Holms 03/20/2020, 2:42 PM

## 2020-03-21 ENCOUNTER — Encounter (HOSPITAL_COMMUNITY): Payer: Self-pay | Admitting: Orthopedic Surgery

## 2020-03-25 NOTE — Anesthesia Postprocedure Evaluation (Signed)
Anesthesia Post Note  Patient: [name]  Procedure(s) Performed: REVERSE SHOULDER ARTHROPLASTY (Right Shoulder)     Patient location during evaluation: PACU Anesthesia Type: Regional and General Level of consciousness: awake and alert Pain management: pain level controlled Vital Signs Assessment: post-procedure vital signs reviewed and stable Respiratory status: spontaneous breathing, nonlabored ventilation, respiratory function stable and patient connected to nasal cannula oxygen Cardiovascular status: blood pressure returned to baseline and stable Postop Assessment: no apparent nausea or vomiting Anesthetic complications: no   No complications documented.  Last Vitals:  Vitals:   03/20/20 1301 03/20/20 1338  BP: (!) 154/77 (!) 148/82  Pulse: 78 85  Resp: 20   Temp: 36.6 C   SpO2: 92% 94%    Last Pain:  Vitals:   03/20/20 1338  PainSc: 0-No pain                 Shiro Ellerman

## 2020-04-02 DIAGNOSIS — M48062 Spinal stenosis, lumbar region with neurogenic claudication: Secondary | ICD-10-CM | POA: Diagnosis not present

## 2020-04-02 DIAGNOSIS — Z96611 Presence of right artificial shoulder joint: Secondary | ICD-10-CM | POA: Diagnosis not present

## 2020-04-02 DIAGNOSIS — Z471 Aftercare following joint replacement surgery: Secondary | ICD-10-CM | POA: Diagnosis not present

## 2020-04-02 DIAGNOSIS — M25511 Pain in right shoulder: Secondary | ICD-10-CM | POA: Diagnosis not present

## 2020-04-02 DIAGNOSIS — G20 Parkinson's disease: Secondary | ICD-10-CM | POA: Diagnosis not present

## 2020-04-02 DIAGNOSIS — G894 Chronic pain syndrome: Secondary | ICD-10-CM | POA: Diagnosis not present

## 2020-04-04 DIAGNOSIS — R040 Epistaxis: Secondary | ICD-10-CM | POA: Diagnosis not present

## 2020-04-04 DIAGNOSIS — H05229 Edema of unspecified orbit: Secondary | ICD-10-CM | POA: Diagnosis not present

## 2020-04-04 DIAGNOSIS — R519 Headache, unspecified: Secondary | ICD-10-CM | POA: Diagnosis not present

## 2020-04-11 DIAGNOSIS — J324 Chronic pansinusitis: Secondary | ICD-10-CM | POA: Diagnosis not present

## 2020-04-23 DIAGNOSIS — Z471 Aftercare following joint replacement surgery: Secondary | ICD-10-CM | POA: Diagnosis not present

## 2020-04-23 DIAGNOSIS — M25511 Pain in right shoulder: Secondary | ICD-10-CM | POA: Diagnosis not present

## 2020-04-25 DIAGNOSIS — M25511 Pain in right shoulder: Secondary | ICD-10-CM | POA: Diagnosis not present

## 2020-04-25 DIAGNOSIS — Z471 Aftercare following joint replacement surgery: Secondary | ICD-10-CM | POA: Diagnosis not present

## 2020-04-28 DIAGNOSIS — M25511 Pain in right shoulder: Secondary | ICD-10-CM | POA: Diagnosis not present

## 2020-04-28 DIAGNOSIS — Z471 Aftercare following joint replacement surgery: Secondary | ICD-10-CM | POA: Diagnosis not present

## 2020-04-30 DIAGNOSIS — M25511 Pain in right shoulder: Secondary | ICD-10-CM | POA: Diagnosis not present

## 2020-04-30 DIAGNOSIS — Z471 Aftercare following joint replacement surgery: Secondary | ICD-10-CM | POA: Diagnosis not present

## 2020-05-07 DIAGNOSIS — Z471 Aftercare following joint replacement surgery: Secondary | ICD-10-CM | POA: Diagnosis not present

## 2020-05-07 DIAGNOSIS — M25511 Pain in right shoulder: Secondary | ICD-10-CM | POA: Diagnosis not present

## 2020-05-09 DIAGNOSIS — Z471 Aftercare following joint replacement surgery: Secondary | ICD-10-CM | POA: Diagnosis not present

## 2020-05-09 DIAGNOSIS — M25511 Pain in right shoulder: Secondary | ICD-10-CM | POA: Diagnosis not present

## 2020-05-12 DIAGNOSIS — M25511 Pain in right shoulder: Secondary | ICD-10-CM | POA: Diagnosis not present

## 2020-05-12 DIAGNOSIS — Z471 Aftercare following joint replacement surgery: Secondary | ICD-10-CM | POA: Diagnosis not present

## 2020-05-14 DIAGNOSIS — M25511 Pain in right shoulder: Secondary | ICD-10-CM | POA: Diagnosis not present

## 2020-05-14 DIAGNOSIS — Z471 Aftercare following joint replacement surgery: Secondary | ICD-10-CM | POA: Diagnosis not present

## 2020-05-19 DIAGNOSIS — M25511 Pain in right shoulder: Secondary | ICD-10-CM | POA: Diagnosis not present

## 2020-05-19 DIAGNOSIS — L218 Other seborrheic dermatitis: Secondary | ICD-10-CM | POA: Diagnosis not present

## 2020-05-19 DIAGNOSIS — L0202 Furuncle of face: Secondary | ICD-10-CM | POA: Diagnosis not present

## 2020-05-19 DIAGNOSIS — Z471 Aftercare following joint replacement surgery: Secondary | ICD-10-CM | POA: Diagnosis not present

## 2020-05-19 DIAGNOSIS — L821 Other seborrheic keratosis: Secondary | ICD-10-CM | POA: Diagnosis not present

## 2020-05-19 DIAGNOSIS — L02821 Furuncle of head [any part, except face]: Secondary | ICD-10-CM | POA: Diagnosis not present

## 2020-05-21 DIAGNOSIS — M25511 Pain in right shoulder: Secondary | ICD-10-CM | POA: Diagnosis not present

## 2020-05-21 DIAGNOSIS — Z471 Aftercare following joint replacement surgery: Secondary | ICD-10-CM | POA: Diagnosis not present

## 2020-05-22 DIAGNOSIS — H02831 Dermatochalasis of right upper eyelid: Secondary | ICD-10-CM | POA: Diagnosis not present

## 2020-05-22 DIAGNOSIS — H02834 Dermatochalasis of left upper eyelid: Secondary | ICD-10-CM | POA: Diagnosis not present

## 2020-05-22 DIAGNOSIS — H401131 Primary open-angle glaucoma, bilateral, mild stage: Secondary | ICD-10-CM | POA: Diagnosis not present

## 2020-05-22 DIAGNOSIS — H52223 Regular astigmatism, bilateral: Secondary | ICD-10-CM | POA: Diagnosis not present

## 2020-05-22 DIAGNOSIS — H2513 Age-related nuclear cataract, bilateral: Secondary | ICD-10-CM | POA: Diagnosis not present

## 2020-05-23 DIAGNOSIS — E0590 Thyrotoxicosis, unspecified without thyrotoxic crisis or storm: Secondary | ICD-10-CM | POA: Diagnosis not present

## 2020-05-23 DIAGNOSIS — I25810 Atherosclerosis of coronary artery bypass graft(s) without angina pectoris: Secondary | ICD-10-CM | POA: Diagnosis not present

## 2020-05-23 DIAGNOSIS — R5382 Chronic fatigue, unspecified: Secondary | ICD-10-CM | POA: Diagnosis not present

## 2020-05-30 DIAGNOSIS — Z471 Aftercare following joint replacement surgery: Secondary | ICD-10-CM | POA: Diagnosis not present

## 2020-05-30 DIAGNOSIS — M25511 Pain in right shoulder: Secondary | ICD-10-CM | POA: Diagnosis not present

## 2020-06-03 DIAGNOSIS — G20 Parkinson's disease: Secondary | ICD-10-CM | POA: Diagnosis not present

## 2020-06-03 DIAGNOSIS — I25810 Atherosclerosis of coronary artery bypass graft(s) without angina pectoris: Secondary | ICD-10-CM | POA: Diagnosis not present

## 2020-06-03 DIAGNOSIS — R5382 Chronic fatigue, unspecified: Secondary | ICD-10-CM | POA: Diagnosis not present

## 2020-06-17 ENCOUNTER — Telehealth: Payer: Self-pay | Admitting: Neurology

## 2020-06-17 MED ORDER — NEUPRO 6 MG/24HR TD PT24: 1.0000 | MEDICATED_PATCH | Freq: Every day | TRANSDERMAL | 0 refills | Status: DC

## 2020-06-17 NOTE — Telephone Encounter (Signed)
We have discussed with patient many, many times that dizziness is not due to his meds (we have stopped them and changed them in past to prove this and he had 2nd opinion at [facility]).  He will need to ask PCP for help.  I have nothing further to offer for this. Does he want to consider DBS?  If so, will need neurocog testing F/u PCP re: sleep

## 2020-06-17 NOTE — Telephone Encounter (Signed)
[facility] have [facility] sent [facility] to [facility]. Patient is aware.  Called patient and he stated that he feels "terrible." Patient states that his eyes aren't  focusing right, he's shaking, and having a lot of dizziness that is making him feel like he is going to pass out. Patient states that he is trying to sleep because he is so tired but but the twitching he is having is making it difficult to sleep. Patient stated that these symptoms have been going on for awhile, but the combination of everything is terrible and he can't sleep. Patient described that his arms feel like there is shocking electricity traveling down his arms and both feet are numb as well. Patient informed me that he has been taking his Carbidopa/Levodopa 2 tablets 5 times daily.  Informed patient that I would send this message over to Dr. Carles Collet and give him a call back once I hear back from her. Patient verbalized understanding.

## 2020-06-17 NOTE — Telephone Encounter (Signed)
Patient called and left a message that he needs refills on his Neupro patches.  He is still having symptoms of dizziness and he said he is unsteady on his feet. Patient also expressed concern his problems are spreading to his eyes. He is wanting a call from nursing staff.  [facility] [address] [facility] in [place], [place]

## 2020-06-18 NOTE — Telephone Encounter (Signed)
Spoke to patients wife and informed her of what was stated by Dr. Carles Collet per message below.   Patients wife expressed frustration and stated that patient has been having electrical volts run all through his body down into to his legs and stated "something is terribly wrong!" And stated, "Isn't that neurological??"    Patients wife stated that patient cannot even see or read with glasses on and has been to the eye doctor multiple times where he was told that he has pressure in his eyes and "something is not right." Patient is going to see a eye specialist in September or October, which was the earliest appt they had.   Patient has commented to his wife "I can't go on like this!" Patient cannot sleep, has nightmares, and is up an down all night.   Patients wife stated that it feels like no doctor knows what is going on and keeps sending them all over with no answers. She stated that it feels as if all they do is pay to see doctor after doctor and no one can tell them what is going on. She wants to know who can help? She stated that her husband is miserable and it's starting to impact her as well.   Patients wife wanted to know if it were possible to admit him into the hospital and run test. They are desperate for answers and want help.    Also, patients wife quietly asked me if she could know what stage parkinson's disease patient is in. She stated she doesn't want the patient to know she asked.   Informed patients wife that I would send Dr. Carles Collet another message letting her know her concerns and give her a call as soon as I hear back.

## 2020-06-18 NOTE — Telephone Encounter (Signed)
I understand that they are frustrated but most of the things that they are complaining about aren't things directly related to his Parkinsons Disease.  He has had back issues with chronic pain and has seen pain management and neurosx with multiple surgeries.  This is out of my field but within the field of Dr. Vertell Limber, whom he has seen previously.  This is likely the source of the "electric shocks."  Many of these things are within the realm of primary care, as well.  Parkinsons Disease doesn't change pressure in the eyes and while I agree its of concern, its unfortunately not something I personally can help with.  He's had other opinions and happy to refer him for another Parkinsons Disease opinion, but much of his complaints are not Parkinsons Disease related.  Offer him opinion at [facility].  I've got nothing else to offer unfortunately.

## 2020-06-20 NOTE — Telephone Encounter (Signed)
Called patient and spoke with patient and his wife. Informed them on what Dr. Carles Collet stated:   "I understand that they are frustrated but most of the things that they are complaining about aren't things directly related to his Parkinsons Disease.  He has had back issues with chronic pain and has seen pain management and neurosx with multiple surgeries.  This is out of my field but within the field of Dr. Vertell Limber, whom he has seen previously.  This is likely the source of the "electric shocks."  Many of these things are within the realm of primary care, as well.  Parkinsons Disease doesn't change pressure in the eyes and while I agree its of concern, its unfortunately not something I personally can help with.  He's had other opinions and happy to refer him for another Parkinsons Disease opinion, but much of his complaints are not Parkinsons Disease related.  Offer him opinion at [facility].  I've got nothing else to offer unfortunately."  After informing them on what Dr. Carles Collet stated, patients wife stated that this is frustrating and that everyone is "washing your hands" of what is going on. I reminded patient that Dr. Carles Collet is unable to help with patients complaints and stated that much of his complaints are not Parkinson's related and to follow up with Dr. Vertell Limber and patients pcp. Patient and wife had no further questions or concerns.

## 2020-06-25 ENCOUNTER — Other Ambulatory Visit: Payer: Self-pay | Admitting: Neurology

## 2020-06-25 DIAGNOSIS — Z471 Aftercare following joint replacement surgery: Secondary | ICD-10-CM | POA: Diagnosis not present

## 2020-06-25 DIAGNOSIS — Z96611 Presence of right artificial shoulder joint: Secondary | ICD-10-CM | POA: Diagnosis not present

## 2020-06-30 ENCOUNTER — Other Ambulatory Visit: Payer: Self-pay | Admitting: Neurology

## 2020-06-30 ENCOUNTER — Telehealth: Payer: Self-pay | Admitting: Neurology

## 2020-06-30 NOTE — Telephone Encounter (Signed)
It looks like it was just refilled 6/14 for 90 days.  Call pharmacy to see

## 2020-07-01 ENCOUNTER — Other Ambulatory Visit: Payer: Self-pay | Admitting: Neurology

## 2020-07-01 NOTE — Telephone Encounter (Signed)
Called patient and informed him that rx was sent to his pharmacy on 6/14 and again on 6/28. Patient thanked me for the call.

## 2020-07-01 NOTE — Telephone Encounter (Signed)
[facility]. She said it didn't have any refills on 6/14 so that why she is calling, to get a new rx. She said he needs those patches, could someone please [facility] or herself back.

## 2020-07-27 ENCOUNTER — Other Ambulatory Visit: Payer: Self-pay | Admitting: Neurology

## 2020-07-30 DIAGNOSIS — M7021 Olecranon bursitis, right elbow: Secondary | ICD-10-CM | POA: Diagnosis not present

## 2020-08-25 ENCOUNTER — Ambulatory Visit: Payer: Medicare Other | Admitting: Neurology

## 2020-09-27 ENCOUNTER — Other Ambulatory Visit: Payer: Self-pay | Admitting: Neurology

## 2020-09-27 DIAGNOSIS — G20 Parkinson's disease: Secondary | ICD-10-CM

## 2020-09-27 DIAGNOSIS — G20A1 Parkinson's disease without dyskinesia, without mention of fluctuations: Secondary | ICD-10-CM

## 2020-09-29 ENCOUNTER — Other Ambulatory Visit: Payer: Self-pay

## 2020-09-29 DIAGNOSIS — G20 Parkinson's disease: Secondary | ICD-10-CM | POA: Diagnosis not present

## 2020-09-29 DIAGNOSIS — M25521 Pain in right elbow: Secondary | ICD-10-CM | POA: Diagnosis not present

## 2020-09-29 DIAGNOSIS — M48062 Spinal stenosis, lumbar region with neurogenic claudication: Secondary | ICD-10-CM | POA: Diagnosis not present

## 2020-09-29 DIAGNOSIS — Z96611 Presence of right artificial shoulder joint: Secondary | ICD-10-CM | POA: Diagnosis not present

## 2020-09-29 DIAGNOSIS — G894 Chronic pain syndrome: Secondary | ICD-10-CM | POA: Diagnosis not present

## 2020-10-24 NOTE — Progress Notes (Deleted)
Assessment/Plan:   1.  Parkinsons Disease  -Levodopa challenge test was done today on March 07, 2020.  We learned several things from this.  We learned that he does not, in fact, have levodopa resistant tremor.  We learned that he is not on enough levodopa (this was well-known to me prior to this).  We learned that levodopa significantly helped symptoms.  The question really is going to be whether or not he will tolerate going up on levodopa, which I have tried multiple times in the past without success.  He reports that he is currently taking carbidopa/levodopa 25/100 CR, 1.5 tablets 5 times per day.  He was willing to try to go up to 2 tablets 5 times per day (this is likely still not enough but it is certainly worth a try).    -We have tried multiple different medications with the patient because of dizziness, including immediate release, extended release, trialing him off of the rotigotine.  None of these options have made a significant change in his dizziness and we have discussed that I do not think, therefore, that the dizziness is related to Parkinson's.  He has sought 2nd opinions about this and [place] [facility] neurology agreed that they did not think that the dizziness was from Parkinson's disease.  -Continue rotigotine, 6 mg daily (we have tried to discontinue this in the past and dizziness persisted)  -Discussed again with patient/family that stem cell is not indicated in Parkinson's disease and why that is.  -Patient is not interested in DBS therapy.  2.  Restless leg  -discussed with pt/daughter that can be associated with Parkinsons Disease  -took self off of low dose klonopin.  He would like to go back on it.  Because he is seeing pain management and has a narcotic contract, he will need to get that from his pain management physician.  We discussed [place] STOP act.  3.  Dizziness  -As above, chronic, but disabling the patient.  Do not think that it is related to Parkinson's disease, and he had a  2nd opinion at [facility] and they did not think so either.  We have tried to reworked his medications and this did not significantly help.  In addition, he came in off of levodopa for over 24 hours and still had dizziness today.  -Cardiologist has placed him on meclizine.  4.  Chronic low back pain  -This is another disabling feature for the patient.  He has had multiple back surgeries.  This has prevented him from exercising, which also has affected the Parkinson's disease.  He has followed with Dr. Maryjean Ka in the past as well as Dr. Vertell Limber.  Given information to online exercise/biking program.    Subjective:   Brett Patterson was seen today in follow up for Parkinsons disease.  My previous records were reviewed prior to todays visit as well as outside records available to me.   Patient with wife who supplements history.  Last visit, levodopa challenge test was done.  He had a significantly positive response to levodopa challenge, which demonstrated to [place] that levodopa does work and that he was underdosed.  We slightly increased his levodopa.  Have received phone call from patient/wife since last visit.  There was several complaints, including significant pain, difficulty sleeping, vision change.  I certainly understand their frustration, but most of the things that they were calling about were unrelated to Parkinson's disease (patient has pain management physician and neurosurgeon).  They became quite frustrated,  and we have certainly offered opinions at other locations, which were declined.  He has been following with the PA at Dr. Melven Sartorius office and those notes are reviewed.  He was last seen on September 26.  He did note that he did not want DBS.  He stated that he was investigating stem cell (he has asked [place] about that in the past as well).  Current prescribed movement disorder medications: Carbidopa/levodopa 25/100 CR, 2 tablets 5 times daily Rotigotine, 6 mg daily   PREVIOUS MEDICATIONS:  carbidopa/levodopa 25/100 IR; clonazepam (dizzy, but patient with chronic dizziness); rotigotine; klonopin (took self off)  ALLERGIES:   Allergies  Allergen Reactions   Latex     Oral irritation    Other     Refuse blood products   Codeine Anxiety    Codeine based cough medicine     CURRENT MEDICATIONS:  Outpatient Encounter Medications as of 10/28/2020  Medication Sig   Ascorbic Acid (VITAMIN C) 1000 MG tablet Take 1,000 mg by mouth daily.   aspirin EC 81 MG tablet Take 81 mg by mouth daily.   atorvastatin (LIPITOR) 80 MG tablet Take 80 mg by mouth daily.   calcium carbonate (TUMS - DOSED IN MG ELEMENTAL CALCIUM) 500 MG chewable tablet Chew 1,000-1,500 mg by mouth daily as needed for indigestion or heartburn.   carbidopa-levodopa (SINEMET IR) 25-100 MG tablet Take 2 tablets by mouth 5 (five) times daily.   Carbidopa-Levodopa ER (SINEMET CR) 25-100 MG tablet controlled release TAKE 1 TABLET BY MOUTH 5 TIMES DAILY.   Cholecalciferol (VITAMIN D3) 1.25 MG (50000 UT) TABS Take 50,000 Units by mouth every Monday. With a probiotic   clopidogrel (PLAVIX) 75 MG tablet Take 75 mg by mouth daily.    Coenzyme Q10 (COQ10) 100 MG CAPS Take 100 mg by mouth daily.   folic acid (FOLVITE) 1 MG tablet Take 1 mg by mouth daily.   Ginger, Zingiber officinalis, (GINGER PO) Take 7 drops by mouth 3 (three) times daily. Liquid ginger supplement   GLUTATHIONE PO Take 1 tablet by mouth daily.   ketoconazole (NIZORAL) 2 % shampoo Apply 1 application topically daily as needed for irritation.   latanoprost (XALATAN) 0.005 % ophthalmic solution Place 1 drop into both eyes at bedtime.   MAGNESIUM CITRATE PO Take 1 tablet by mouth daily.   NEUPRO 6 MG/24HR APPLY 1 PATCH TO CLEAN, DRY SKIN ONCE A DAY   nitroGLYCERIN (NITROSTAT) 0.4 MG SL tablet Place 0.4 mg under the tongue every 5 (five) minutes as needed for chest pain.   ondansetron (ZOFRAN) 4 MG tablet Take 1 tablet (4 mg total) by mouth every 8 (eight) hours as  needed for nausea or vomiting.   OVER THE COUNTER MEDICATION Take 1 capsule by mouth 2 (two) times daily. nerve renew otc supplement   OVER THE COUNTER MEDICATION Take 1 tablet by mouth daily. Livit-2   OVER THE COUNTER MEDICATION Take 1 Dose by mouth in the morning and at bedtime. Para-A liquid supplement   oxyCODONE-acetaminophen (PERCOCET/ROXICET) 5-325 MG tablet Take 1 tablet by mouth every 8 (eight) hours as needed for severe pain (to supplement current Percocet RX for post op breakthrough pain).   timolol (TIMOPTIC) 0.25 % ophthalmic solution Place 1 drop into both eyes daily.   triamcinolone (KENALOG) 0.025 % cream Apply 1 application topically 2 (two) times daily as needed for irritation.   Zinc 50 MG CAPS Take 50 mg by mouth daily.   No facility-administered encounter medications on file as  of 10/28/2020.    Objective:   PHYSICAL EXAMINATION:    VITALS:   There were no vitals filed for this visit.   GEN:  The patient appears stated age and is in NAD. HEENT:  Normocephalic, atraumatic.  The mucous membranes are moist. The superficial temporal arteries are without ropiness or tenderness. CV:  RRR Lungs:  CTAB Neck/HEME:  There are no carotid bruits bilaterally.  Neurological examination:  Orientation: The patient is alert and oriented x3. Cranial nerves: There is good facial symmetry with facial hypomimia. The speech is fluent and clear. Soft palate rises symmetrically and there is no tongue deviation. Hearing is intact to conversational tone. Sensation: Sensation is intact to light touch throughout Motor: Strength is at least antigravity x4.  Levodopa challenge done today.  UPDRS motor off score was 53.  Pt then given 300 mg of levodopa dissolved in ginger ale and waited 40 minutes to re-examine him.  UPDRS motor on score was 23.  Details of UPDRS motor score documented on separate neurophysiologic worksheet.     Movement examination: Tone: Prior to the addition of  levodopa, rigidity was worse on the right and was at least moderate.  Following the addition of levodopa, there was only slight rigidity on the right and mild on the left. Abnormal movements: Prior to the addition of levodopa, there was bilateral upper and right lower extremity resting tremor.  Following the addition of levodopa, there was just some intermittent left upper extremity rest tremor Coordination:  There is  decremation with RAM's, with any form of RAMS, including alternating supination and pronation of the forearm, hand opening and closing, finger taps, heel taps and toe taps,  Gait and Station: The patient was unable to arise without the use of his hands prior to the addition of levodopa.  Following the addition of levodopa, he was able to arise without the use of his hands (took several attempts).  He had a positive pull test prior to the addition of levodopa (he would have actually fallen spontaneously).  Following the addition of levodopa, he had a negative pull test.  I have reviewed and interpreted the following labs independently    Chemistry      Component Value Date/Time   NA 138 03/17/2020 1101   K 4.0 03/17/2020 1101   CL 102 03/17/2020 1101   CO2 25 03/17/2020 1101   BUN 23 03/17/2020 1101   CREATININE 0.76 03/17/2020 1101      Component Value Date/Time   CALCIUM 9.5 03/17/2020 1101       Lab Results  Component Value Date   WBC 6.9 03/17/2020   HGB 14.5 03/17/2020   HCT 44.4 03/17/2020   MCV 95.5 03/17/2020   PLT 212 03/17/2020    No results found for: TSH   Total time spent on today's visit was *** minutes, including both face-to-face time and nonface-to-face time.  Time included that spent on review of records (prior notes available to me/labs/imaging if pertinent), discussing treatment and goals, answering patient's questions and coordinating care.  Cc:  Quentin Cornwall, MD

## 2020-10-28 ENCOUNTER — Ambulatory Visit: Payer: Medicare Other | Admitting: Neurology

## 2020-10-30 ENCOUNTER — Telehealth: Payer: Self-pay | Admitting: Neurology

## 2020-10-30 ENCOUNTER — Other Ambulatory Visit: Payer: Self-pay

## 2020-10-30 ENCOUNTER — Encounter: Payer: Self-pay | Admitting: Neurology

## 2020-10-30 NOTE — Telephone Encounter (Signed)
Late yesterday afternoon around 3:15 PM the patient came into the office stating he was checking in to see Dr. Carles Collet.   The patient was not on the schedule so I looked at his past appointments and saw he was scheduled for the day before at 3:30 PM.   He came in on the wrong day by mistake.   When I explained that to him, he called his wife and put her on speaker phone.   Mrs. Alper was very loud and expressed frustration and irritation with the issue that the patient could not be seen even though it was the wrong day.  I expressed empathy as they do drive from [place], [place] and the patient stated he needs care but Dr. Carles Collet was not in the office that afternoon.   Dr. Carles Collet does not work on Wednesday afternoons.  Mrs. Mazur continued to raise her voice at me and requested the very next appointment due to "the confusion" and the patient needing care.  Each appointment that I offered, his wife declined due to schedule constraints and increased the tone of her voice.  She was still on speaker phone and other patient's were in the lobby, checking out, and on the phone. It was a busy time of day in the office and this was disruptive behavior.  I asked for help from my one-up leader but she was on the phone and stated I needed to get the office manager.  Mrs. Soria then began stating negative comments about the doctors availability, the treatment he has gotten here, and about frustration with the staff.   I tried to discontinue the call as the patient was in front of me trembling but his wife just kept continuing on ranting at this point. His trembling worsened the longer the call went on.  He finally disconnected the call with his wife and I went ahead and scheduled him for January with Dr. Carles Collet at his request.   He requested a ROI to transfer care and I gave him one.  Patient stated, "It's not your fault, thank you for your help."

## 2020-10-30 NOTE — Telephone Encounter (Signed)
Called patient at 9:25 am spoke to patient and advised patient that he will be receiving a dismissal letter from Dr. Carles Collet due to patient/provider relationship being compromised. Patient feels that he has not received appropriate care from Dr. Carles Collet and our office. I had Haynes Dage, Office Team Leader witness the phone call. Patient was very upset and stated that he will be contacting is Chief Executive Officer.Patient was informed that he would have 30 day supply of mediation and after that point he would need to make sure he has another physician or his PCP refill medications. Informed patient per his request that we would send a release of medical records form with the discharge letter per his request. Patient verbalized understanding and once more stated that he would be contacting his attorney.

## 2020-10-31 ENCOUNTER — Ambulatory Visit: Payer: Medicare Other | Admitting: Neurology

## 2020-11-02 ENCOUNTER — Other Ambulatory Visit: Payer: Self-pay | Admitting: Neurology

## 2020-11-02 DIAGNOSIS — G20 Parkinson's disease: Secondary | ICD-10-CM

## 2020-11-03 DIAGNOSIS — R42 Dizziness and giddiness: Secondary | ICD-10-CM | POA: Diagnosis not present

## 2020-11-03 DIAGNOSIS — G20 Parkinson's disease: Secondary | ICD-10-CM | POA: Diagnosis not present

## 2020-11-03 DIAGNOSIS — R278 Other lack of coordination: Secondary | ICD-10-CM | POA: Diagnosis not present

## 2020-11-03 DIAGNOSIS — R2689 Other abnormalities of gait and mobility: Secondary | ICD-10-CM | POA: Diagnosis not present

## 2020-11-04 ENCOUNTER — Telehealth: Payer: Self-pay

## 2020-11-04 ENCOUNTER — Other Ambulatory Visit: Payer: Self-pay

## 2020-11-04 ENCOUNTER — Telehealth: Payer: Self-pay | Admitting: Neurology

## 2020-11-04 NOTE — Telephone Encounter (Signed)
Checked patients file and he has already had an appointment at [facility] yesterday and patients medication was called in for him at the appointment he had yesterday at [facility]

## 2020-11-04 NOTE — Telephone Encounter (Signed)
Patient dismissed from [facility] by Wells Guiles Tat, DO, effective 10/30/20. Dismissal Letter sent out by 1st class mail. KLM

## 2020-11-19 DIAGNOSIS — D3162 Benign neoplasm of unspecified site of left orbit: Secondary | ICD-10-CM | POA: Diagnosis not present

## 2020-11-25 ENCOUNTER — Ambulatory Visit: Payer: Medicare Other | Admitting: Neurology

## 2020-11-25 DIAGNOSIS — D3192 Benign neoplasm of unspecified part of left eye: Secondary | ICD-10-CM | POA: Diagnosis not present

## 2020-11-29 DIAGNOSIS — R059 Cough, unspecified: Secondary | ICD-10-CM | POA: Diagnosis not present

## 2020-11-29 DIAGNOSIS — J101 Influenza due to other identified influenza virus with other respiratory manifestations: Secondary | ICD-10-CM | POA: Diagnosis not present

## 2020-12-25 DIAGNOSIS — H401131 Primary open-angle glaucoma, bilateral, mild stage: Secondary | ICD-10-CM | POA: Diagnosis not present

## 2020-12-25 DIAGNOSIS — H2513 Age-related nuclear cataract, bilateral: Secondary | ICD-10-CM | POA: Diagnosis not present

## 2020-12-30 DIAGNOSIS — M48062 Spinal stenosis, lumbar region with neurogenic claudication: Secondary | ICD-10-CM | POA: Diagnosis not present

## 2020-12-30 DIAGNOSIS — G20 Parkinson's disease: Secondary | ICD-10-CM | POA: Diagnosis not present

## 2020-12-30 DIAGNOSIS — G894 Chronic pain syndrome: Secondary | ICD-10-CM | POA: Diagnosis not present

## 2020-12-30 DIAGNOSIS — Z6832 Body mass index (BMI) 32.0-32.9, adult: Secondary | ICD-10-CM | POA: Diagnosis not present

## 2021-01-01 DIAGNOSIS — G20 Parkinson's disease: Secondary | ICD-10-CM | POA: Diagnosis not present

## 2021-01-01 DIAGNOSIS — I25810 Atherosclerosis of coronary artery bypass graft(s) without angina pectoris: Secondary | ICD-10-CM | POA: Diagnosis not present

## 2021-01-01 DIAGNOSIS — E0590 Thyrotoxicosis, unspecified without thyrotoxic crisis or storm: Secondary | ICD-10-CM | POA: Diagnosis not present

## 2021-01-01 DIAGNOSIS — R0609 Other forms of dyspnea: Secondary | ICD-10-CM | POA: Diagnosis not present

## 2021-01-01 DIAGNOSIS — E782 Mixed hyperlipidemia: Secondary | ICD-10-CM | POA: Diagnosis not present

## 2021-01-08 DIAGNOSIS — R0609 Other forms of dyspnea: Secondary | ICD-10-CM | POA: Diagnosis not present

## 2021-01-15 ENCOUNTER — Ambulatory Visit: Payer: Medicare Other | Admitting: Neurology

## 2021-01-20 DIAGNOSIS — G20 Parkinson's disease: Secondary | ICD-10-CM | POA: Diagnosis not present

## 2021-01-20 DIAGNOSIS — I671 Cerebral aneurysm, nonruptured: Secondary | ICD-10-CM | POA: Diagnosis not present

## 2021-01-27 DIAGNOSIS — U071 COVID-19: Secondary | ICD-10-CM | POA: Diagnosis not present

## 2021-01-27 DIAGNOSIS — Z20828 Contact with and (suspected) exposure to other viral communicable diseases: Secondary | ICD-10-CM | POA: Diagnosis not present

## 2021-01-29 DIAGNOSIS — I671 Cerebral aneurysm, nonruptured: Secondary | ICD-10-CM | POA: Diagnosis not present

## 2021-01-29 DIAGNOSIS — Z79899 Other long term (current) drug therapy: Secondary | ICD-10-CM | POA: Diagnosis not present

## 2021-01-29 DIAGNOSIS — Z7902 Long term (current) use of antithrombotics/antiplatelets: Secondary | ICD-10-CM | POA: Diagnosis not present

## 2021-01-29 DIAGNOSIS — Z885 Allergy status to narcotic agent status: Secondary | ICD-10-CM | POA: Diagnosis not present

## 2021-01-29 DIAGNOSIS — Z85828 Personal history of other malignant neoplasm of skin: Secondary | ICD-10-CM | POA: Diagnosis not present

## 2021-01-29 DIAGNOSIS — Z951 Presence of aortocoronary bypass graft: Secondary | ICD-10-CM | POA: Diagnosis not present

## 2021-01-29 DIAGNOSIS — I2510 Atherosclerotic heart disease of native coronary artery without angina pectoris: Secondary | ICD-10-CM | POA: Diagnosis not present

## 2021-01-29 DIAGNOSIS — I10 Essential (primary) hypertension: Secondary | ICD-10-CM | POA: Diagnosis not present

## 2021-01-29 DIAGNOSIS — G20 Parkinson's disease: Secondary | ICD-10-CM | POA: Diagnosis not present

## 2021-01-29 DIAGNOSIS — Z538 Procedure and treatment not carried out for other reasons: Secondary | ICD-10-CM | POA: Diagnosis not present

## 2021-01-29 DIAGNOSIS — Z955 Presence of coronary angioplasty implant and graft: Secondary | ICD-10-CM | POA: Diagnosis not present

## 2021-01-29 DIAGNOSIS — Z5309 Procedure and treatment not carried out because of other contraindication: Secondary | ICD-10-CM | POA: Diagnosis not present

## 2021-01-29 DIAGNOSIS — E782 Mixed hyperlipidemia: Secondary | ICD-10-CM | POA: Diagnosis not present

## 2021-01-29 DIAGNOSIS — Z7982 Long term (current) use of aspirin: Secondary | ICD-10-CM | POA: Diagnosis not present

## 2021-02-10 DIAGNOSIS — I671 Cerebral aneurysm, nonruptured: Secondary | ICD-10-CM | POA: Diagnosis not present

## 2021-02-23 DIAGNOSIS — R251 Tremor, unspecified: Secondary | ICD-10-CM | POA: Diagnosis not present

## 2021-02-23 DIAGNOSIS — R200 Anesthesia of skin: Secondary | ICD-10-CM | POA: Diagnosis not present

## 2021-02-23 DIAGNOSIS — G20 Parkinson's disease: Secondary | ICD-10-CM | POA: Diagnosis not present

## 2021-02-23 DIAGNOSIS — M48061 Spinal stenosis, lumbar region without neurogenic claudication: Secondary | ICD-10-CM | POA: Diagnosis not present

## 2021-03-11 DIAGNOSIS — G4752 REM sleep behavior disorder: Secondary | ICD-10-CM | POA: Diagnosis not present

## 2021-03-11 DIAGNOSIS — R278 Other lack of coordination: Secondary | ICD-10-CM | POA: Diagnosis not present

## 2021-03-11 DIAGNOSIS — G2581 Restless legs syndrome: Secondary | ICD-10-CM | POA: Diagnosis not present

## 2021-03-11 DIAGNOSIS — G20 Parkinson's disease: Secondary | ICD-10-CM | POA: Diagnosis not present

## 2021-03-16 DIAGNOSIS — G894 Chronic pain syndrome: Secondary | ICD-10-CM | POA: Diagnosis not present

## 2021-03-16 DIAGNOSIS — Z96611 Presence of right artificial shoulder joint: Secondary | ICD-10-CM | POA: Diagnosis not present

## 2021-03-16 DIAGNOSIS — G20 Parkinson's disease: Secondary | ICD-10-CM | POA: Diagnosis not present

## 2021-03-16 DIAGNOSIS — M48062 Spinal stenosis, lumbar region with neurogenic claudication: Secondary | ICD-10-CM | POA: Diagnosis not present

## 2021-03-30 DIAGNOSIS — I739 Peripheral vascular disease, unspecified: Secondary | ICD-10-CM | POA: Diagnosis not present

## 2021-03-30 DIAGNOSIS — R5383 Other fatigue: Secondary | ICD-10-CM | POA: Diagnosis not present

## 2021-03-30 DIAGNOSIS — E782 Mixed hyperlipidemia: Secondary | ICD-10-CM | POA: Diagnosis not present

## 2021-03-30 DIAGNOSIS — I10 Essential (primary) hypertension: Secondary | ICD-10-CM | POA: Diagnosis not present

## 2021-04-14 DIAGNOSIS — F419 Anxiety disorder, unspecified: Secondary | ICD-10-CM | POA: Diagnosis not present

## 2021-04-14 DIAGNOSIS — R0600 Dyspnea, unspecified: Secondary | ICD-10-CM | POA: Diagnosis not present

## 2021-04-14 DIAGNOSIS — G459 Transient cerebral ischemic attack, unspecified: Secondary | ICD-10-CM | POA: Diagnosis not present

## 2021-04-14 DIAGNOSIS — Z7902 Long term (current) use of antithrombotics/antiplatelets: Secondary | ICD-10-CM | POA: Diagnosis not present

## 2021-04-14 DIAGNOSIS — G20 Parkinson's disease: Secondary | ICD-10-CM | POA: Diagnosis not present

## 2021-04-14 DIAGNOSIS — R110 Nausea: Secondary | ICD-10-CM | POA: Diagnosis not present

## 2021-04-14 DIAGNOSIS — H538 Other visual disturbances: Secondary | ICD-10-CM | POA: Diagnosis not present

## 2021-04-14 DIAGNOSIS — I2510 Atherosclerotic heart disease of native coronary artery without angina pectoris: Secondary | ICD-10-CM | POA: Diagnosis not present

## 2021-04-14 DIAGNOSIS — Z8673 Personal history of transient ischemic attack (TIA), and cerebral infarction without residual deficits: Secondary | ICD-10-CM | POA: Diagnosis not present

## 2021-04-14 DIAGNOSIS — R42 Dizziness and giddiness: Secondary | ICD-10-CM | POA: Diagnosis not present

## 2021-04-14 DIAGNOSIS — I671 Cerebral aneurysm, nonruptured: Secondary | ICD-10-CM | POA: Diagnosis not present

## 2021-04-14 DIAGNOSIS — J9811 Atelectasis: Secondary | ICD-10-CM | POA: Diagnosis not present

## 2021-04-14 DIAGNOSIS — G629 Polyneuropathy, unspecified: Secondary | ICD-10-CM | POA: Diagnosis not present

## 2021-04-14 DIAGNOSIS — I739 Peripheral vascular disease, unspecified: Secondary | ICD-10-CM | POA: Diagnosis not present

## 2021-04-14 DIAGNOSIS — Z951 Presence of aortocoronary bypass graft: Secondary | ICD-10-CM | POA: Diagnosis not present

## 2021-04-14 DIAGNOSIS — J479 Bronchiectasis, uncomplicated: Secondary | ICD-10-CM | POA: Diagnosis not present

## 2021-04-14 DIAGNOSIS — E785 Hyperlipidemia, unspecified: Secondary | ICD-10-CM | POA: Diagnosis not present

## 2021-04-14 DIAGNOSIS — I10 Essential (primary) hypertension: Secondary | ICD-10-CM | POA: Diagnosis not present

## 2021-04-14 DIAGNOSIS — E041 Nontoxic single thyroid nodule: Secondary | ICD-10-CM | POA: Diagnosis not present

## 2021-04-15 DIAGNOSIS — G20 Parkinson's disease: Secondary | ICD-10-CM | POA: Diagnosis not present

## 2021-04-15 DIAGNOSIS — R42 Dizziness and giddiness: Secondary | ICD-10-CM | POA: Diagnosis not present

## 2021-04-15 DIAGNOSIS — G459 Transient cerebral ischemic attack, unspecified: Secondary | ICD-10-CM | POA: Diagnosis not present

## 2021-04-15 DIAGNOSIS — H538 Other visual disturbances: Secondary | ICD-10-CM | POA: Diagnosis not present

## 2021-04-15 DIAGNOSIS — R0600 Dyspnea, unspecified: Secondary | ICD-10-CM | POA: Diagnosis not present

## 2021-05-20 DIAGNOSIS — L570 Actinic keratosis: Secondary | ICD-10-CM | POA: Diagnosis not present

## 2021-05-20 DIAGNOSIS — L821 Other seborrheic keratosis: Secondary | ICD-10-CM | POA: Diagnosis not present

## 2021-05-20 DIAGNOSIS — L858 Other specified epidermal thickening: Secondary | ICD-10-CM | POA: Diagnosis not present

## 2021-05-20 DIAGNOSIS — D485 Neoplasm of uncertain behavior of skin: Secondary | ICD-10-CM | POA: Diagnosis not present

## 2021-05-20 DIAGNOSIS — C44519 Basal cell carcinoma of skin of other part of trunk: Secondary | ICD-10-CM | POA: Diagnosis not present

## 2021-05-23 DIAGNOSIS — E041 Nontoxic single thyroid nodule: Secondary | ICD-10-CM | POA: Diagnosis not present

## 2021-06-09 DIAGNOSIS — C44519 Basal cell carcinoma of skin of other part of trunk: Secondary | ICD-10-CM | POA: Diagnosis not present

## 2021-06-16 DIAGNOSIS — Z96611 Presence of right artificial shoulder joint: Secondary | ICD-10-CM | POA: Diagnosis not present

## 2021-06-22 DIAGNOSIS — Z6832 Body mass index (BMI) 32.0-32.9, adult: Secondary | ICD-10-CM | POA: Diagnosis not present

## 2021-06-22 DIAGNOSIS — M48062 Spinal stenosis, lumbar region with neurogenic claudication: Secondary | ICD-10-CM | POA: Diagnosis not present

## 2021-06-22 DIAGNOSIS — G894 Chronic pain syndrome: Secondary | ICD-10-CM | POA: Diagnosis not present

## 2021-06-22 DIAGNOSIS — G20 Parkinson's disease: Secondary | ICD-10-CM | POA: Diagnosis not present

## 2021-06-29 DIAGNOSIS — I671 Cerebral aneurysm, nonruptured: Secondary | ICD-10-CM | POA: Diagnosis not present

## 2021-07-22 DIAGNOSIS — H2513 Age-related nuclear cataract, bilateral: Secondary | ICD-10-CM | POA: Diagnosis not present

## 2021-07-22 DIAGNOSIS — H52223 Regular astigmatism, bilateral: Secondary | ICD-10-CM | POA: Diagnosis not present

## 2021-07-22 DIAGNOSIS — H401131 Primary open-angle glaucoma, bilateral, mild stage: Secondary | ICD-10-CM | POA: Diagnosis not present

## 2021-07-22 DIAGNOSIS — H02831 Dermatochalasis of right upper eyelid: Secondary | ICD-10-CM | POA: Diagnosis not present

## 2021-07-22 DIAGNOSIS — H02834 Dermatochalasis of left upper eyelid: Secondary | ICD-10-CM | POA: Diagnosis not present

## 2021-07-23 DIAGNOSIS — E782 Mixed hyperlipidemia: Secondary | ICD-10-CM | POA: Diagnosis not present

## 2021-07-23 DIAGNOSIS — I25810 Atherosclerosis of coronary artery bypass graft(s) without angina pectoris: Secondary | ICD-10-CM | POA: Diagnosis not present

## 2021-07-23 DIAGNOSIS — E042 Nontoxic multinodular goiter: Secondary | ICD-10-CM | POA: Diagnosis not present

## 2021-07-23 DIAGNOSIS — G20 Parkinson's disease: Secondary | ICD-10-CM | POA: Diagnosis not present

## 2021-07-27 DIAGNOSIS — E041 Nontoxic single thyroid nodule: Secondary | ICD-10-CM | POA: Diagnosis not present

## 2021-08-12 DIAGNOSIS — G2581 Restless legs syndrome: Secondary | ICD-10-CM | POA: Diagnosis not present

## 2021-08-12 DIAGNOSIS — G20 Parkinson's disease: Secondary | ICD-10-CM | POA: Diagnosis not present

## 2021-08-12 DIAGNOSIS — G629 Polyneuropathy, unspecified: Secondary | ICD-10-CM | POA: Diagnosis not present

## 2021-09-08 DIAGNOSIS — I671 Cerebral aneurysm, nonruptured: Secondary | ICD-10-CM | POA: Diagnosis not present

## 2021-10-05 DIAGNOSIS — L720 Epidermal cyst: Secondary | ICD-10-CM | POA: Diagnosis not present

## 2021-10-05 DIAGNOSIS — L814 Other melanin hyperpigmentation: Secondary | ICD-10-CM | POA: Diagnosis not present

## 2021-10-05 DIAGNOSIS — L821 Other seborrheic keratosis: Secondary | ICD-10-CM | POA: Diagnosis not present

## 2021-10-05 DIAGNOSIS — L570 Actinic keratosis: Secondary | ICD-10-CM | POA: Diagnosis not present

## 2021-10-05 DIAGNOSIS — Z1283 Encounter for screening for malignant neoplasm of skin: Secondary | ICD-10-CM | POA: Diagnosis not present

## 2021-10-09 DIAGNOSIS — H02845 Edema of left lower eyelid: Secondary | ICD-10-CM | POA: Diagnosis not present

## 2021-10-14 DIAGNOSIS — L720 Epidermal cyst: Secondary | ICD-10-CM | POA: Diagnosis not present

## 2021-10-14 DIAGNOSIS — D1801 Hemangioma of skin and subcutaneous tissue: Secondary | ICD-10-CM | POA: Diagnosis not present

## 2021-10-21 DIAGNOSIS — H0289 Other specified disorders of eyelid: Secondary | ICD-10-CM | POA: Diagnosis not present

## 2021-10-21 DIAGNOSIS — H0589 Other disorders of orbit: Secondary | ICD-10-CM | POA: Diagnosis not present

## 2021-10-27 DIAGNOSIS — L720 Epidermal cyst: Secondary | ICD-10-CM | POA: Diagnosis not present

## 2021-10-27 DIAGNOSIS — D485 Neoplasm of uncertain behavior of skin: Secondary | ICD-10-CM | POA: Diagnosis not present

## 2021-10-27 DIAGNOSIS — R208 Other disturbances of skin sensation: Secondary | ICD-10-CM | POA: Diagnosis not present

## 2021-10-27 DIAGNOSIS — Z789 Other specified health status: Secondary | ICD-10-CM | POA: Diagnosis not present

## 2021-11-16 DIAGNOSIS — G20A1 Parkinson's disease without dyskinesia, without mention of fluctuations: Secondary | ICD-10-CM | POA: Diagnosis not present

## 2021-11-16 DIAGNOSIS — I671 Cerebral aneurysm, nonruptured: Secondary | ICD-10-CM | POA: Diagnosis not present

## 2021-11-24 DIAGNOSIS — G894 Chronic pain syndrome: Secondary | ICD-10-CM | POA: Diagnosis not present

## 2021-11-24 DIAGNOSIS — M48062 Spinal stenosis, lumbar region with neurogenic claudication: Secondary | ICD-10-CM | POA: Diagnosis not present

## 2021-11-24 DIAGNOSIS — F1120 Opioid dependence, uncomplicated: Secondary | ICD-10-CM | POA: Diagnosis not present

## 2021-12-01 DIAGNOSIS — K219 Gastro-esophageal reflux disease without esophagitis: Secondary | ICD-10-CM | POA: Diagnosis not present

## 2021-12-01 DIAGNOSIS — R1084 Generalized abdominal pain: Secondary | ICD-10-CM | POA: Diagnosis not present

## 2021-12-01 DIAGNOSIS — I25810 Atherosclerosis of coronary artery bypass graft(s) without angina pectoris: Secondary | ICD-10-CM | POA: Diagnosis not present

## 2021-12-01 DIAGNOSIS — E0590 Thyrotoxicosis, unspecified without thyrotoxic crisis or storm: Secondary | ICD-10-CM | POA: Diagnosis not present

## 2021-12-01 DIAGNOSIS — E782 Mixed hyperlipidemia: Secondary | ICD-10-CM | POA: Diagnosis not present

## 2021-12-03 DIAGNOSIS — E782 Mixed hyperlipidemia: Secondary | ICD-10-CM | POA: Diagnosis not present

## 2021-12-03 DIAGNOSIS — R5383 Other fatigue: Secondary | ICD-10-CM | POA: Diagnosis not present

## 2021-12-03 DIAGNOSIS — I10 Essential (primary) hypertension: Secondary | ICD-10-CM | POA: Diagnosis not present

## 2021-12-03 DIAGNOSIS — I739 Peripheral vascular disease, unspecified: Secondary | ICD-10-CM | POA: Diagnosis not present

## 2021-12-04 DIAGNOSIS — G20A1 Parkinson's disease without dyskinesia, without mention of fluctuations: Secondary | ICD-10-CM | POA: Diagnosis not present

## 2021-12-04 DIAGNOSIS — I2510 Atherosclerotic heart disease of native coronary artery without angina pectoris: Secondary | ICD-10-CM | POA: Diagnosis not present

## 2021-12-04 DIAGNOSIS — E782 Mixed hyperlipidemia: Secondary | ICD-10-CM | POA: Diagnosis not present

## 2021-12-04 DIAGNOSIS — Z91040 Latex allergy status: Secondary | ICD-10-CM | POA: Diagnosis not present

## 2021-12-04 DIAGNOSIS — Z79899 Other long term (current) drug therapy: Secondary | ICD-10-CM | POA: Diagnosis not present

## 2021-12-04 DIAGNOSIS — I10 Essential (primary) hypertension: Secondary | ICD-10-CM | POA: Diagnosis not present

## 2021-12-04 DIAGNOSIS — K219 Gastro-esophageal reflux disease without esophagitis: Secondary | ICD-10-CM | POA: Diagnosis not present

## 2021-12-04 DIAGNOSIS — G4733 Obstructive sleep apnea (adult) (pediatric): Secondary | ICD-10-CM | POA: Diagnosis not present

## 2021-12-04 DIAGNOSIS — H0589 Other disorders of orbit: Secondary | ICD-10-CM | POA: Diagnosis not present

## 2021-12-04 DIAGNOSIS — Z885 Allergy status to narcotic agent status: Secondary | ICD-10-CM | POA: Diagnosis not present

## 2021-12-07 DIAGNOSIS — G20C Parkinsonism, unspecified: Secondary | ICD-10-CM | POA: Diagnosis not present

## 2021-12-07 DIAGNOSIS — R531 Weakness: Secondary | ICD-10-CM | POA: Diagnosis not present

## 2021-12-07 DIAGNOSIS — R0789 Other chest pain: Secondary | ICD-10-CM | POA: Diagnosis not present

## 2021-12-07 DIAGNOSIS — R110 Nausea: Secondary | ICD-10-CM | POA: Diagnosis not present

## 2021-12-07 DIAGNOSIS — R0600 Dyspnea, unspecified: Secondary | ICD-10-CM | POA: Diagnosis not present

## 2021-12-07 DIAGNOSIS — R932 Abnormal findings on diagnostic imaging of liver and biliary tract: Secondary | ICD-10-CM | POA: Diagnosis not present

## 2021-12-07 DIAGNOSIS — E7800 Pure hypercholesterolemia, unspecified: Secondary | ICD-10-CM | POA: Diagnosis not present

## 2021-12-07 DIAGNOSIS — I10 Essential (primary) hypertension: Secondary | ICD-10-CM | POA: Diagnosis not present

## 2021-12-07 DIAGNOSIS — G2581 Restless legs syndrome: Secondary | ICD-10-CM | POA: Diagnosis not present

## 2021-12-07 DIAGNOSIS — I2510 Atherosclerotic heart disease of native coronary artery without angina pectoris: Secondary | ICD-10-CM | POA: Diagnosis not present

## 2021-12-07 DIAGNOSIS — R1010 Upper abdominal pain, unspecified: Secondary | ICD-10-CM | POA: Diagnosis not present

## 2021-12-07 DIAGNOSIS — M6281 Muscle weakness (generalized): Secondary | ICD-10-CM | POA: Diagnosis not present

## 2021-12-07 DIAGNOSIS — R1013 Epigastric pain: Secondary | ICD-10-CM | POA: Diagnosis not present

## 2021-12-07 DIAGNOSIS — Z5320 Procedure and treatment not carried out because of patient's decision for unspecified reasons: Secondary | ICD-10-CM | POA: Diagnosis not present

## 2021-12-07 DIAGNOSIS — K5900 Constipation, unspecified: Secondary | ICD-10-CM | POA: Diagnosis not present

## 2021-12-11 DIAGNOSIS — R350 Frequency of micturition: Secondary | ICD-10-CM | POA: Diagnosis not present

## 2021-12-11 DIAGNOSIS — R319 Hematuria, unspecified: Secondary | ICD-10-CM | POA: Diagnosis not present

## 2022-01-06 ENCOUNTER — Telehealth: Payer: Self-pay | Admitting: Internal Medicine

## 2022-01-06 NOTE — Telephone Encounter (Signed)
Good afternoon Dr. Lorenso Courier,   Supervising Provider 01/06/2022  This patient called our practice. He would like to be established with [facility] for his GI care and be scheduled for an office visit because of an esophageal issue he recently went to the hospital for. Patient had a colonoscopy in 2017 with Dr. Carlyon Prows at [facility]. Patient stated he does not live there anymore and would like to see a [facility] practice. Records are in epic for your review. Please advise on scheduling.    Thank you.

## 2022-01-07 ENCOUNTER — Encounter: Payer: Self-pay | Admitting: Nurse Practitioner

## 2022-01-07 NOTE — Telephone Encounter (Signed)
Patient is scheduled on 01/27/22 at 9:30 am with Tye Savoy.

## 2022-01-19 DIAGNOSIS — H2513 Age-related nuclear cataract, bilateral: Secondary | ICD-10-CM | POA: Diagnosis not present

## 2022-01-19 DIAGNOSIS — H401131 Primary open-angle glaucoma, bilateral, mild stage: Secondary | ICD-10-CM | POA: Diagnosis not present

## 2022-01-27 ENCOUNTER — Encounter: Payer: Self-pay | Admitting: Nurse Practitioner

## 2022-01-27 ENCOUNTER — Ambulatory Visit (INDEPENDENT_AMBULATORY_CARE_PROVIDER_SITE_OTHER): Payer: BC Managed Care – PPO | Admitting: Nurse Practitioner

## 2022-01-27 VITALS — BP 140/80 | HR 76 | Ht 67.0 in | Wt 207.2 lb

## 2022-01-27 DIAGNOSIS — Z7901 Long term (current) use of anticoagulants: Secondary | ICD-10-CM

## 2022-01-27 DIAGNOSIS — K5900 Constipation, unspecified: Secondary | ICD-10-CM | POA: Diagnosis not present

## 2022-01-27 DIAGNOSIS — Z86010 Personal history of colonic polyps: Secondary | ICD-10-CM

## 2022-01-27 DIAGNOSIS — R110 Nausea: Secondary | ICD-10-CM | POA: Diagnosis not present

## 2022-01-27 DIAGNOSIS — R079 Chest pain, unspecified: Secondary | ICD-10-CM | POA: Diagnosis not present

## 2022-01-27 MED ORDER — ONDANSETRON HCL 4 MG PO TABS: 4.0000 mg | ORAL_TABLET | Freq: Three times a day (TID) | ORAL | 0 refills | Status: DC | PRN

## 2022-01-27 MED ORDER — NA SULFATE-K SULFATE-MG SULF 17.5-3.13-1.6 GM/177ML PO SOLN: 1.0000 | Freq: Once | ORAL | 0 refills | Status: AC

## 2022-01-27 NOTE — Progress Notes (Signed)
Assessment    Patient profile:  NASIM HABEEB is a 72 y.o. year old male, new to the practice and self referred.  He has a past medical history of colon polyps, GERD , hypertension, CAD s/p CABG hyperlipidemia, Parkinson disease, arthritis, glaucoma and Lyme's disease.   See PMH / [facility] for additional history  # 72 yo male with nausea and generalized lower chest discomfort. No improvement with PPI. Mylanta helps but doesn't alleviated the symptoms  # Hx of colon polyps. Advanced polyp ( TVA) in 2017 ( Dr. Oneida Alar). Surveillance colonoscopy was not done  # History of post-polypectomy bleed in 2012.   # Bowel changes. Describes hard stool followed by loose stool. Constipation likely secondary to medications (opioids + Parkinson's medication and Parkinson's. Disease).   # CAD, s/p CABG. On Plavix.    # Recent left lower eyelid anterior orbitotomy for removal of fatty lesion.  Wife tells me Anesthesia wanted procedure done at the hospital.   # Parkinson's disease.   Plan:    Schedule a surveillance colonoscopy with Dr. Candis Schatz. The risks and benefits of colonoscopy with possible polypectomy / biopsies were discussed and the patient agrees to proceed. Two day bowel prep Hold Plavix for 5 days before procedure - will instruct when and how to resume after procedure. Patient understands that there is a low but real risk of cardiovascular event such as heart attack, stroke, or embolism /  thrombosis, or ischemia while off Plavix. The patient consents to proceed. Will communicate by phone or EMR with patient's prescribing provider to confirm that holding Plavix is reasonable in this case.  Schedule for EGD for evaluation of nausea, lower chest discomfort. The risks and benefits of EGD with possible biopsies were discussed with the patient who agrees to proceed.  Continue Omeprazole 40 mg daily before breakfast.  Trial of Benefiber 1-2 times / day Increase water 60 oz / day.  Trial of  Zofran 4 mg TID as needed for nausea.  I will ask our our Anesthesia provider to review chart to make sure he has no concerns about doing procedures at [facility].    HPI:    Chief Complaint: chest discomfort, nausea,  Patient was previously followed by Dr. Oneida Alar for history colon polyps.  His last colonoscopy was in 2017 with removal of 2 sessile polyps ranging from 4 to 12 mm in size.  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  He was sent a letter Sept 2020 reminding him to schedule a follow-up colonoscopy .   Morty is here with discomfort in lower chest with associated nausea. Pain is located across chest ( just below nipple line). Symptoms present for nearly a year and occur on a daily basis.  Feels like "something" is trying to come up from his stomach but he never actually regurgitates or vomits though has frequent nausea.  Bending over makes symptoms worse. Symptoms also worse after eating  He has hx of CAD and has seen his cardiologist with these symptoms but told they were are not cardiac . Sees Dr. Sondra Come in [place], [place].   Four weeks ago PCP started Omeprazole  40 mg daily before breakfast. He hasn't noticed much improvement in symptoms.  He is taking PPI and Tums. Takes Mylanta sometimes and thinks that heps more than anything.  He is swallowing his food okay. Weight is overall stable.   Previous Labs / Imaging::    Latest Ref Rng &  Units 03/17/2020   11:01 AM 07/11/2018    4:55 PM 04/28/2010   10:55 AM  CBC  WBC 4.0 - 10.5 K/uL 6.9  7.7  7.2   Hemoglobin 13.0 - 17.0 g/dL 14.5  15.4  14.2   Hematocrit 39.0 - 52.0 % 44.4  47.6  42.2   Platelets 150 - 400 K/uL 212  243  268     No results found for: "LIPASE"    Latest Ref Rng & Units 03/17/2020   11:01 AM 07/11/2018    4:55 PM 04/28/2010   10:55 AM  CMP  Glucose 70 - 99 mg/dL 108  104  112   BUN 8 - 23 mg/dL '23  18  14   '$ Creatinine 0.61 - 1.24 mg/dL 0.76  0.95  0.86   Sodium 135  - 145 mmol/L 138  141  138   Potassium 3.5 - 5.1 mmol/L 4.0  3.9  4.0   Chloride 98 - 111 mmol/L 102  104  106   CO2 22 - 32 mmol/L '25  26  26   '$ Calcium 8.9 - 10.3 mg/dL 9.5  9.7  9.1     Past Medical History:  Diagnosis Date   Arthritis    CAD (coronary artery disease)    Cancer (HCC)    basal cell   GERD (gastroesophageal reflux disease)    Glaucoma    unsure if closed/open angle glaucoma   Hyperlipidemia    Hypertension    Lyme disease    pt reports "chronic lyme" since the 1970s   Parkinson disease ([place])    Past Surgical History:  Procedure Laterality Date   BICEPS TENDON REPAIR Left    COLONOSCOPY N/A 10/13/2015   Procedure: COLONOSCOPY;  Surgeon: Danie Binder, MD;  Location: [facility];  Service: Endoscopy;  Laterality: N/A;  10:30 Am   CORONARY ARTERY BYPASS GRAFT     ORIF ORBITAL FRACTURE Right    REVERSE SHOULDER ARTHROPLASTY Right 03/20/2020   Procedure: REVERSE SHOULDER ARTHROPLASTY;  Surgeon: Justice Britain, MD;  Location: [facility] ORS;  Service: Orthopedics;  Laterality: Right;  169mn   SPINAL FIXATION SURGERY W/ IMPLANT     times 5   [place]  01/2018   11/2017   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     aspirin EC 81 MG tablet Take 81 mg by mouth daily.     atorvastatin (LIPITOR) 80 MG tablet Take 80 mg by mouth daily.     calcium carbonate (TUMS - DOSED IN MG ELEMENTAL CALCIUM) 500 MG chewable tablet Chew  1,000-1,500 mg by mouth daily as needed for indigestion or heartburn.     carbidopa-levodopa (SINEMET IR) 25-100 MG tablet Take 2 tablets by mouth 5 (five) times daily. 900 tablet 0   Carbidopa-Levodopa ER (SINEMET CR) 25-100 MG tablet controlled release TAKE  1 TABLET BY MOUTH 5 TIMES DAILY. 150 tablet 0   Cholecalciferol (VITAMIN D3) 1.25 MG (50000 UT) TABS Take 50,000 Units by mouth every Monday. With a probiotic     clopidogrel (PLAVIX) 75 MG tablet Take 75 mg by mouth daily.      Coenzyme Q10 (COQ10) 100 MG CAPS Take 100 mg by mouth daily.     folic acid (FOLVITE) 1 MG tablet Take 1 mg by mouth daily.     Ginger, Zingiber officinalis, (GINGER PO) Take 7 drops by mouth 3 (three) times daily. Liquid ginger supplement     GLUTATHIONE PO Take 1 tablet by mouth daily.     ketoconazole (NIZORAL) 2 % shampoo Apply 1 application topically daily as needed for irritation.     latanoprost (XALATAN) 0.005 % ophthalmic solution Place 1 drop into both eyes at bedtime.     MAGNESIUM CITRATE PO Take 1 tablet by mouth daily.     NEUPRO 6 MG/24HR APPLY 1 PATCH TO CLEAN, DRY SKIN ONCE A DAY 30 patch 0   nitroGLYCERIN (NITROSTAT) 0.4 MG SL tablet Place 0.4 mg under the tongue every 5 (five) minutes as needed for chest pain.  3   ondansetron (ZOFRAN) 4 MG tablet Take 1 tablet (4 mg total) by mouth every 8 (eight) hours as needed for nausea or vomiting. 10 tablet 0   OVER THE COUNTER MEDICATION Take 1 capsule by mouth 2 (two) times daily. nerve renew otc supplement     OVER THE COUNTER MEDICATION Take 1 tablet by mouth daily. Livit-2     OVER THE COUNTER MEDICATION Take 1 Dose by mouth in the morning and at bedtime. Para-A liquid supplement     oxyCODONE-acetaminophen (PERCOCET/ROXICET) 5-325 MG tablet Take 1 tablet by mouth every 8 (eight) hours as needed for severe pain (to supplement current Percocet RX for post op breakthrough pain). 30 tablet 0   timolol (TIMOPTIC) 0.25 % ophthalmic solution Place 1 drop into  both eyes daily.     triamcinolone (KENALOG) 0.025 % cream Apply 1 application topically 2 (two) times daily as needed for irritation.     Zinc 50 MG CAPS Take 50 mg by mouth daily.     No current facility-administered medications for this visit.   Allergies  Allergen Reactions   Latex     Oral irritation    Other     Refuse blood products   Codeine Anxiety    Codeine based cough medicine      Review of Systems: Positive for anxiety, arthritis, back pain, blood in urine, fatigue, itching, muscle pain and cramps, shortness of breath, skin rash, sleeping problems, excessive urination.  All other systems reviewed and negative except where noted in HPI.   Wt Readings from Last 3 Encounters:  03/20/20 208 lb (94.3 kg)  03/17/20 208 lb (94.3 kg)  03/07/20 209 lb (94.8 kg)    Physical Exam   BP (!) 140/80   Pulse 76   Ht '5\' 7"'$  (1.702 m)   Wt 207 lb 3.2 oz (94 kg)   SpO2 98%   BMI 32.45 kg/m  Constitutional:  Generally well appearing male in no acute distress. Psychiatric: Pleasant. Normal mood and affect. Behavior is normal. EENT: Pupils normal.  Conjunctivae are normal. No scleral icterus. Neck supple.  Cardiovascular: Normal rate, regular rhythm.  Pulmonary/chest: Effort normal and breath sounds normal. No wheezing, rales or rhonchi. Abdominal: Soft, nondistended, nontender. Bowel sounds active throughout. There are no masses palpable. No hepatomegaly. Neurological:  Alert and oriented to person place and time. Right hand tremors Extremities: No edema Skin: Skin is warm and dry. No rashes noted.  Tye Savoy, NP  01/27/2022, 8:15 AM

## 2022-01-27 NOTE — Patient Instructions (Addendum)
You have been scheduled for an endoscopy and colonoscopy. Please follow the written instructions given to you at your visit today. Please pick up your prep supplies at the pharmacy within the next 1-3 days. If you use inhalers (even only as needed), please bring them with you on the day of your procedure.   We have sent the following medications to your pharmacy for you to pick up at your convenience: [place] will be contaced by our office prior to your procedure for directions on holding your Plavix.  If you do not hear from our office 1 week prior to your scheduled procedure, please call [phone] to discuss.    Continue Omeprazole 40 mg daily before breakfast.  Trial of Benefiber 1-2 times / day Increase water intake 60 oz / day Trial of Zofran 4 mg three times daily # 30  as needed for nausea. May worsen constipation. .    _______________________________________________________  If your blood pressure at your visit was 140/90 or greater, please contact your primary care physician to follow up on this.  _______________________________________________________  If you are age 52 or older, your body mass index should be between 23-30. Your Body mass index is 32.45 kg/m. If this is out of the aforementioned range listed, please consider follow up with your Primary Care Provider.  __________________________________________________________  The [facility] providers would like to encourage you to use [facility] to communicate with providers for non-urgent requests or questions.  Due to long hold times on the telephone, sending your provider a message by [facility] may be a faster and more efficient way to get a response.  Please allow 48 business hours for a response.  Please remember that this is for non-urgent requests.   Due to recent changes in healthcare laws, you may see the results of your imaging and laboratory studies on MyChart before your provider has had a chance to  review them.  We understand that in some cases there may be results that are confusing or concerning to you. Not all laboratory results come back in the same time frame and the provider may be waiting for multiple results in order to interpret others.  Please give [place] 48 hours in order for your provider to thoroughly review all the results before contacting the office for clarification of your results.     Thank you for choosing me and [facility].  Tye Savoy, NP.

## 2022-01-28 ENCOUNTER — Telehealth: Payer: Self-pay

## 2022-01-28 NOTE — Telephone Encounter (Signed)
Received fax from Dr. Everitt Amber office stating that Patient may hold Plavix 5 days prior to procedure. Patient made aware. Patient verbalized understanding. Fax sent to scan.

## 2022-01-29 NOTE — Telephone Encounter (Signed)
Fax sent today to Dr. Everitt Amber office requesting for Cardiac Clearance as well. We already received fax from Dr. Everitt Amber office stating that Patient may hold Plavix 5 days prior to procedure.

## 2022-02-02 NOTE — Progress Notes (Signed)
Agree with the assessment and plan as outlined by Brett Savoy, [facility].  I was not able to see anything in Anesthesia's record from his recent eye surgery that indicated any anesthestic difficulty.  He does not have any medical contraindications that I can see that would prohibit his procedures being done from [facility].  Agree with cardiac clearance to hold Plavix.  Defer to our anesthesia for final clearance at [facility].  Anaalicia Reimann E. Candis Schatz, MD [facility]

## 2022-02-03 NOTE — Telephone Encounter (Signed)
Spoke with a Scientist, product/process development. Stated they received the fax that I sent earlier. Informed her that we need a separate urgent medical clearance although we already received clearance for Plavix on 01/27/22. Stated she would give it to the Dr. Everitt Amber nurse today to sign off medical clearance. I will contact Dr. Everitt Amber office, if I don't receive clearance within couple of days.

## 2022-02-03 NOTE — Telephone Encounter (Signed)
Refaxed to Dr. Everitt Amber office requesting for Cardiac Clearance at [phone].  Called office at [phone] to follow up on Urgent cardiac clearance and LVM to give [place] a call with an update. Will continue effort.

## 2022-02-05 NOTE — Telephone Encounter (Signed)
Received Cardiac clearance from Dr. Everitt Amber office. Reviewed and approved by Osvaldo Angst, CRNA. Fax sent to scan.

## 2022-02-10 ENCOUNTER — Telehealth: Payer: Self-pay | Admitting: Gastroenterology

## 2022-02-10 DIAGNOSIS — M25551 Pain in right hip: Secondary | ICD-10-CM | POA: Diagnosis not present

## 2022-02-10 DIAGNOSIS — M961 Postlaminectomy syndrome, not elsewhere classified: Secondary | ICD-10-CM | POA: Diagnosis not present

## 2022-02-10 NOTE — Telephone Encounter (Signed)
Inbound call from patients wife stating that patient is scheduled to have both endo and colon tomorrow 2/8 with Dr. Candis Schatz and has went to the doctor and has a torn hamstring on his left side. Patients wife is requesting a call back to discuss if patient needs to reschedule or if he can still have procedure. Please advise.

## 2022-02-10 NOTE — Telephone Encounter (Signed)
Patient's wife called, to follow up.

## 2022-02-10 NOTE — Telephone Encounter (Signed)
Returned call and spoke to the patient. Explained in detail that it was up to him as to whether he felt he could complete the prep process and tolerate laying on his left side for at least 45 minutes maybe even longer. Pt reported that he tore the right hamstring, not the left. He then asked me to call his wife to discuss as well. Spoke with wife and explained the same about tolerating prep and positioning for the procedure. Pt's wife stated she thought that the MD told him he could not lay on the left side but that she is not completely sure. RN instructed that pt would have to lay on the left side. Wife reported that MD at urgent care recommended to reschedule the procedure as to not risk tearing the hamstring any further. Wife wants pt to proceed. She will discuss with pt and they will make a decision and will call back to cancel if needed. At the end of the call all questions answered to both the pt and his wife. Procedure not cancelled at this time. Will make MD aware.

## 2022-02-11 ENCOUNTER — Ambulatory Visit: Payer: BC Managed Care – PPO | Admitting: Gastroenterology

## 2022-02-11 ENCOUNTER — Encounter: Payer: Self-pay | Admitting: Gastroenterology

## 2022-02-11 VITALS — BP 145/76 | HR 73 | Temp 99.1°F | Resp 22 | Ht 67.0 in | Wt 207.0 lb

## 2022-02-11 DIAGNOSIS — D124 Benign neoplasm of descending colon: Secondary | ICD-10-CM | POA: Diagnosis not present

## 2022-02-11 DIAGNOSIS — Z09 Encounter for follow-up examination after completed treatment for conditions other than malignant neoplasm: Secondary | ICD-10-CM

## 2022-02-11 DIAGNOSIS — Z86010 Personal history of colonic polyps: Secondary | ICD-10-CM | POA: Diagnosis not present

## 2022-02-11 DIAGNOSIS — D123 Benign neoplasm of transverse colon: Secondary | ICD-10-CM

## 2022-02-11 DIAGNOSIS — K5900 Constipation, unspecified: Secondary | ICD-10-CM

## 2022-02-11 DIAGNOSIS — K2100 Gastro-esophageal reflux disease with esophagitis, without bleeding: Secondary | ICD-10-CM | POA: Diagnosis not present

## 2022-02-11 DIAGNOSIS — K635 Polyp of colon: Secondary | ICD-10-CM | POA: Diagnosis not present

## 2022-02-11 DIAGNOSIS — R110 Nausea: Secondary | ICD-10-CM

## 2022-02-11 MED ORDER — SODIUM CHLORIDE 0.9 % IV SOLN: 500.0000 mL | Freq: Once | INTRAVENOUS | Status: AC

## 2022-02-11 NOTE — Op Note (Signed)
[place] Patient Name: Brett Patterson Procedure Date: 02/11/2022 1:55 PM MRN: [id number] Endoscopist: Nicki Reaper E. Candis Schatz , MD, [id number] Age: 72 Referring MD:  Date of Birth: 26-Feb-1950 Gender: Male Account #: [id number] Procedure:                Colonoscopy Indications:              High risk colon cancer surveillance: Personal                            history of adenoma with villous component, Last                            colonoscopy: October 2017 Medicines:                Monitored Anesthesia Care Procedure:                Pre-Anesthesia Assessment:                           - Prior to the procedure, a History and Physical                            was performed, and patient medications and                            allergies were reviewed. The patient's tolerance of                            previous anesthesia was also reviewed. The risks                            and benefits of the procedure and the sedation                            options and risks were discussed with the patient.                            All questions were answered, and informed consent                            was obtained. Prior Anticoagulants: The patient has                            taken Plavix (clopidogrel), last dose was 5 days                            prior to procedure. ASA Grade Assessment: III - A                            patient with severe systemic disease. After                            reviewing the risks and benefits, the patient was  deemed in satisfactory condition to undergo the                            procedure.                           After obtaining informed consent, the colonoscope                            was passed under direct vision. Throughout the                            procedure, the patient's blood pressure, pulse, and                            oxygen saturations were monitored continuously. The                             CF [id number] #[id number] was introduced through the anus                            and advanced to the the terminal ileum, with                            identification of the appendiceal orifice and IC                            valve. The colonoscopy was performed without                            difficulty. The patient tolerated the procedure                            well. The quality of the bowel preparation was                            good. The terminal ileum, ileocecal valve,                            appendiceal orifice, and rectum were photographed.                            The bowel preparation used was SUPREP via split                            dose instruction. Scope In: 2:24:04 PM Scope Out: 2:43:04 PM Scope Withdrawal Time: 0 hours 15 minutes 49 seconds  Total Procedure Duration: 0 hours 19 minutes 0 seconds  Findings:                 The perianal and digital rectal examinations were                            normal. Pertinent negatives include normal  sphincter tone and no palpable rectal lesions.                           A 3 mm polyp was found in the hepatic flexure. The                            polyp was sessile. The polyp was removed with a                            cold snare. Resection and retrieval were complete.                            Estimated blood loss was minimal.                           A 3 mm polyp was found in the splenic flexure. The                            polyp was sessile. The polyp was removed with a                            cold snare. Resection and retrieval were complete.                            Estimated blood loss was minimal.                           A 4 mm polyp was found in the descending colon. The                            polyp was sessile. The polyp was removed with a                            cold snare. Resection and retrieval were complete.                             Estimated blood loss was minimal.                           A single medium-mouthed diverticulum was found in                            the transverse colon.                           A medium post polypectomy scar was found in the                            rectum. There was no evidence of the previous polyp.                           The exam was otherwise normal throughout the  examined colon.                           The terminal ileum appeared normal.                           The retroflexed view of the distal rectum and anal                            verge was normal and showed no anal or rectal                            abnormalities. Complications:            No immediate complications. Estimated Blood Loss:     Estimated blood loss was minimal. Impression:               - One 3 mm polyp at the hepatic flexure, removed                            with a cold snare. Resected and retrieved.                           - One 3 mm polyp at the splenic flexure, removed                            with a cold snare. Resected and retrieved.                           - One 4 mm polyp in the descending colon, removed                            with a cold snare. Resected and retrieved.                           - Diverticulosis in the transverse colon.                           - Post-polypectomy scar in the rectum.                           - The examined portion of the ileum was normal.                           - The distal rectum and anal verge are normal on                            retroflexion view. Recommendation:           - Patient has a contact number available for                            emergencies. The signs and symptoms of potential  delayed complications were discussed with the                            patient. Return to normal activities tomorrow.                            Written discharge instructions were  provided to the                            patient.                           - Resume previous diet.                           - Continue present medications.                           - Await pathology results.                           - Repeat colonoscopy date to be determined after                            pending pathology results are reviewed for                            surveillance.                           - Resume Plavix (clopidogrel) at prior dose                            tomorrow. Farzana Koci E. Candis Schatz, MD 02/11/2022 2:58:34 PM This report has been signed electronically.

## 2022-02-11 NOTE — Progress Notes (Signed)
History and Physical Interval Note:  02/11/2022 2:03 PM  Brett Patterson  has presented today for endoscopic procedure(s), with the diagnosis of  Encounter Diagnoses  Name Primary?   Constipation, unspecified constipation type Yes   Nausea without vomiting   .  The various methods of evaluation and treatment have been discussed with the patient and/or family. After consideration of risks, benefits and other options for treatment, the patient has consented to  the endoscopic procedure(s).   The patient's history has been reviewed, patient examined, no change in status, stable for endoscopic procedure(s).  I have reviewed the patient's chart and labs.  Questions were answered to the patient's satisfaction.    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 Oneida Alar.  Two other smaller tubular adenomas were removed as well.   Brett Beattie E. Candis Schatz, MD [facility]

## 2022-02-11 NOTE — Progress Notes (Signed)
Called to room to assist during endoscopic procedure.  Patient ID and intended procedure confirmed with present staff. Received instructions for my participation in the procedure from the performing physician.  

## 2022-02-11 NOTE — Progress Notes (Signed)
VSS NAD TRANS TO PACU  

## 2022-02-11 NOTE — Progress Notes (Signed)
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  Very restlesss in bed.  States that his feet are pain. Patient was turned from side to side per his request.  States that his legs are very painful.  Dr. Candis Schatz stated that his wife could give him his pain meds here.  Patient's tremors reduced significantly. Patient was able to walk to the wheelchair with help.  Patient discharged with wife.   Stated that he felt better.

## 2022-02-11 NOTE — Patient Instructions (Signed)
Resume your plavix tomorrow.  Resume your regular medications today.  Take your stomach medicine 1/2 hour before a meal, not on a full stomach.  You will need a repeat colonoscopy in 5 years.  YOU HAD AN ENDOSCOPIC PROCEDURE TODAY AT [facility]:   Refer to the procedure report that was given to you for any specific questions about what was found during the examination.  If the procedure report does not answer your questions, please call your gastroenterologist to clarify.  If you requested that your care partner not be given the details of your procedure findings, then the procedure report has been included in a sealed envelope for you to review at your convenience later.  YOU SHOULD EXPECT: Some feelings of bloating in the abdomen. Passage of more gas than usual.  Walking can help get rid of the air that was put into your GI tract during the procedure and reduce the bloating. If you had a lower endoscopy (such as a colonoscopy or flexible sigmoidoscopy) you may notice spotting of blood in your stool or on the toilet paper. If you underwent a bowel prep for your procedure, you may not have a normal bowel movement for a few days.  Please Note:  You might notice some irritation and congestion in your nose or some drainage.  This is from the oxygen used during your procedure.  There is no need for concern and it should clear up in a day or so.  SYMPTOMS TO REPORT IMMEDIATELY:  Following lower endoscopy (colonoscopy or flexible sigmoidoscopy):  Excessive amounts of blood in the stool  Significant tenderness or worsening of abdominal pains  Swelling of the abdomen that is new, acute  Fever of 100F or higher  Following upper endoscopy (EGD)  Vomiting of blood or coffee ground material  New chest pain or pain under the shoulder blades  Painful or persistently difficult swallowing  New shortness of breath  Fever of 100F or higher  Black, tarry-looking stools  For urgent or  emergent issues, a gastroenterologist can be reached at any hour by calling [phone]. Do not use MyChart messaging for urgent concerns.    DIET:  We do recommend a small meal at first, but then you may proceed to your regular diet.  Drink plenty of fluids but you should avoid alcoholic beverages for 24 hours.  ACTIVITY:  You should plan to take it easy for the rest of today and you should NOT DRIVE or use heavy machinery until tomorrow (because of the sedation medicines used during the test).    FOLLOW UP: Our staff will call the number listed on your records the next business day following your procedure.  We will call around 7:15- 8:00 am to check on you and address any questions or concerns that you may have regarding the information given to you following your procedure. If we do not reach you, we will leave a message.     If any biopsies were taken you will be contacted by phone or by letter within the next 1-3 weeks.  Please call us at [phone] if you have not heard about the biopsies in 3 weeks.    SIGNATURES/CONFIDENTIALITY: You and/or your care partner have signed paperwork which will be entered into your electronic medical record.  These signatures attest to the fact that that the information above on your After Visit Summary has been reviewed and is understood.  Full responsibility of the confidentiality of this discharge information lies  with you and/or your care-partner.

## 2022-02-11 NOTE — Op Note (Signed)
[place] Patient Name: Brett Patterson Procedure Date: 02/11/2022 2:05 PM MRN: [id number] Endoscopist: [place]. Candis Schatz , MD, [id number] Age: 72 Referring MD:  Date of Birth: May 16, 1950 Gender: Male Account #: [id number] Procedure:                Upper GI endoscopy Indications:              Chest pain (non cardiac), Nausea Medicines:                Monitored Anesthesia Care Procedure:                Pre-Anesthesia Assessment:                           - Prior to the procedure, a History and Physical                            was performed, and patient medications and                            allergies were reviewed. The patient's tolerance of                            previous anesthesia was also reviewed. The risks                            and benefits of the procedure and the sedation                            options and risks were discussed with the patient.                            All questions were answered, and informed consent                            was obtained. Prior Anticoagulants: The patient has                            taken Plavix (clopidogrel), last dose was 5 days                            prior to procedure. ASA Grade Assessment: III - A                            patient with severe systemic disease. After                            reviewing the risks and benefits, the patient was                            deemed in satisfactory condition to undergo the                            procedure.  After obtaining informed consent, the endoscope was                            passed under direct vision. Throughout the                            procedure, the patient's blood pressure, pulse, and                            oxygen saturations were monitored continuously. The                            Olympus Scope [id number] was introduced through the                            mouth, and advanced to the third part of  duodenum.                            The upper GI endoscopy was accomplished without                            difficulty. The patient tolerated the procedure                            well. Scope In: Scope Out: Findings:                 The examined portions of the nasopharynx,                            oropharynx and larynx were normal.                           The Z-line was irregular.                           The exam of the esophagus was otherwise normal.                           The gastroesophageal flap valve was visualized                            endoscopically and classified as Hill Grade III                            (minimal fold, loose to endoscope, hiatal hernia                            likely).                           The entire examined stomach was normal. Biopsies                            were taken with a cold forceps for Helicobacter  pylori testing. Estimated blood loss was minimal.                           The examined duodenum was normal. Complications:            No immediate complications. Estimated Blood Loss:     Estimated blood loss was minimal. Impression:               - The examined portions of the nasopharynx,                            oropharynx and larynx were normal.                           - Z-line irregular.                           - Gastroesophageal flap valve classified as Hill                            Grade III (minimal fold, loose to endoscope, hiatal                            hernia likely).                           - Normal stomach. Biopsied.                           - Normal examined duodenum.                           - Suspect patient's symptoms are related to                            gastroesophageal reflux. Recommendation:           - Patient has a contact number available for                            emergencies. The signs and symptoms of potential                            delayed  complications were discussed with the                            patient. Return to normal activities tomorrow.                            Written discharge instructions were provided to the                            patient.                           - Resume previous diet.                           -  Continue present medications.                           - Await pathology results.                           - Follow up as needed for further evaluation and                            management of GI symptoms.                           - Recommend anti-reflux diet, avoiding meals within                            4 hours of bedtime and elevating head of bed at                            night.                           - Continue omeprazole 40 mg PO daily [name] E. Candis Schatz, MD 02/11/2022 2:50:46 PM This report has been signed electronically.

## 2022-02-12 ENCOUNTER — Telehealth: Payer: Self-pay | Admitting: *Deleted

## 2022-02-12 NOTE — Telephone Encounter (Signed)
  Follow up Call-     02/11/2022    1:42 PM  Call back number  Post procedure Call Back phone  # [phone]  Permission to leave phone message Yes     Patient questions:  Do you have a fever, pain , or abdominal swelling? No. Pain Score  0 *  Have you tolerated food without any problems? Yes.    Have you been able to return to your normal activities? Yes.    Do you have any questions about your discharge instructions: Diet   No. Medications  No. Follow up visit  No.  Do you have questions or concerns about your Care? No.  Actions: * If pain score is 4 or above: No action needed, pain <4.

## 2022-02-17 DIAGNOSIS — M25551 Pain in right hip: Secondary | ICD-10-CM | POA: Diagnosis not present

## 2022-02-19 DIAGNOSIS — M25551 Pain in right hip: Secondary | ICD-10-CM | POA: Diagnosis not present

## 2022-02-21 NOTE — Progress Notes (Signed)
Brett Patterson,  The biopsies taken from your stomach were normal, with no evidence of Helicobacter pylori infection. As discussed, your symptoms may be secondary to acid reflux, which can usually be controlled with dietary modifications and acid reducing medications.  One of the three polyps which I removed during your recent procedure was proven to be completely benign but is considered a "pre-cancerous" polyp that MAY have grown into cancer if it had not been removed.  Studies shows that at least 20% of women over age 33 and 30% of men over age 70 have pre-cancerous polyps.   Because of your history of an 'advanced' polyp on a previous colonoscopy, I would recommend consideration of another colonoscopy in 5 years, depending on your relative health at that time.  If you develop any new rectal bleeding, abdominal pain or significant bowel habit changes, please contact me before then.

## 2022-02-22 DIAGNOSIS — F1120 Opioid dependence, uncomplicated: Secondary | ICD-10-CM | POA: Diagnosis not present

## 2022-02-22 DIAGNOSIS — G894 Chronic pain syndrome: Secondary | ICD-10-CM | POA: Diagnosis not present

## 2022-02-22 DIAGNOSIS — M48062 Spinal stenosis, lumbar region with neurogenic claudication: Secondary | ICD-10-CM | POA: Diagnosis not present

## 2022-02-22 DIAGNOSIS — Z6832 Body mass index (BMI) 32.0-32.9, adult: Secondary | ICD-10-CM | POA: Diagnosis not present

## 2022-02-25 DIAGNOSIS — M25551 Pain in right hip: Secondary | ICD-10-CM | POA: Diagnosis not present

## 2022-02-26 DIAGNOSIS — S0990XA Unspecified injury of head, initial encounter: Secondary | ICD-10-CM | POA: Diagnosis not present

## 2022-03-01 DIAGNOSIS — M25551 Pain in right hip: Secondary | ICD-10-CM | POA: Diagnosis not present

## 2022-03-03 DIAGNOSIS — M25551 Pain in right hip: Secondary | ICD-10-CM | POA: Diagnosis not present

## 2022-03-10 DIAGNOSIS — M25551 Pain in right hip: Secondary | ICD-10-CM | POA: Diagnosis not present

## 2022-03-30 ENCOUNTER — Telehealth: Payer: Self-pay | Admitting: Gastroenterology

## 2022-03-30 NOTE — Telephone Encounter (Signed)
Diet placed in the mail as requested.

## 2022-03-30 NOTE — Telephone Encounter (Signed)
PT wife was told that patient would need to be on a special diet to help his reflux. It was never given to her and she would like it mailed to her.

## 2022-04-07 DIAGNOSIS — L918 Other hypertrophic disorders of the skin: Secondary | ICD-10-CM | POA: Diagnosis not present

## 2022-04-07 DIAGNOSIS — D1801 Hemangioma of skin and subcutaneous tissue: Secondary | ICD-10-CM | POA: Diagnosis not present

## 2022-04-07 DIAGNOSIS — L821 Other seborrheic keratosis: Secondary | ICD-10-CM | POA: Diagnosis not present

## 2022-04-22 DIAGNOSIS — G20A2 Parkinson's disease without dyskinesia, with fluctuations: Secondary | ICD-10-CM | POA: Diagnosis not present

## 2022-04-28 DIAGNOSIS — R42 Dizziness and giddiness: Secondary | ICD-10-CM | POA: Diagnosis not present

## 2022-04-28 DIAGNOSIS — R110 Nausea: Secondary | ICD-10-CM | POA: Diagnosis not present

## 2022-04-28 DIAGNOSIS — G629 Polyneuropathy, unspecified: Secondary | ICD-10-CM | POA: Diagnosis not present

## 2022-04-28 DIAGNOSIS — R197 Diarrhea, unspecified: Secondary | ICD-10-CM | POA: Diagnosis not present

## 2022-04-28 DIAGNOSIS — R0602 Shortness of breath: Secondary | ICD-10-CM | POA: Diagnosis not present

## 2022-05-10 DIAGNOSIS — F419 Anxiety disorder, unspecified: Secondary | ICD-10-CM | POA: Diagnosis not present

## 2022-05-25 DIAGNOSIS — G20B2 Parkinson's disease with dyskinesia, with fluctuations: Secondary | ICD-10-CM | POA: Diagnosis not present

## 2022-05-25 DIAGNOSIS — E782 Mixed hyperlipidemia: Secondary | ICD-10-CM | POA: Diagnosis not present

## 2022-05-25 DIAGNOSIS — K5900 Constipation, unspecified: Secondary | ICD-10-CM | POA: Diagnosis not present

## 2022-05-25 DIAGNOSIS — E042 Nontoxic multinodular goiter: Secondary | ICD-10-CM | POA: Diagnosis not present

## 2022-05-25 DIAGNOSIS — I25810 Atherosclerosis of coronary artery bypass graft(s) without angina pectoris: Secondary | ICD-10-CM | POA: Diagnosis not present

## 2022-06-02 DIAGNOSIS — G894 Chronic pain syndrome: Secondary | ICD-10-CM | POA: Diagnosis not present

## 2022-06-02 DIAGNOSIS — F1120 Opioid dependence, uncomplicated: Secondary | ICD-10-CM | POA: Diagnosis not present

## 2022-06-02 DIAGNOSIS — Z6830 Body mass index (BMI) 30.0-30.9, adult: Secondary | ICD-10-CM | POA: Diagnosis not present

## 2022-06-02 DIAGNOSIS — M5416 Radiculopathy, lumbar region: Secondary | ICD-10-CM | POA: Diagnosis not present

## 2022-06-02 DIAGNOSIS — M48062 Spinal stenosis, lumbar region with neurogenic claudication: Secondary | ICD-10-CM | POA: Diagnosis not present

## 2022-06-21 DIAGNOSIS — K219 Gastro-esophageal reflux disease without esophagitis: Secondary | ICD-10-CM | POA: Diagnosis not present

## 2022-06-21 DIAGNOSIS — R300 Dysuria: Secondary | ICD-10-CM | POA: Diagnosis not present

## 2022-06-21 DIAGNOSIS — F419 Anxiety disorder, unspecified: Secondary | ICD-10-CM | POA: Diagnosis not present

## 2022-06-22 ENCOUNTER — Telehealth: Payer: Self-pay | Admitting: Gastroenterology

## 2022-06-22 ENCOUNTER — Ambulatory Visit: Payer: Medicare Other | Admitting: Nurse Practitioner

## 2022-06-22 NOTE — Telephone Encounter (Signed)
Inbound call from patient wife stating he is having a lot of nausea and stomach gurgling. Has not been eating. Also stated the patient is going through a lot of Mylanta medication. Requesting a call back to be advised on what to do. Please advise, thank you.

## 2022-06-22 NOTE — Telephone Encounter (Signed)
Spoke with pts wife and she reports the pt has been having a lot of issues with Reflux and nausea. Reports he is taking a lot of mylanta. Pt scheduled to see Willette Cluster NP tomorrow at 10:30am. Wife aware of the appt.

## 2022-06-23 ENCOUNTER — Encounter: Payer: Self-pay | Admitting: Nurse Practitioner

## 2022-06-23 ENCOUNTER — Ambulatory Visit (INDEPENDENT_AMBULATORY_CARE_PROVIDER_SITE_OTHER): Payer: BC Managed Care – PPO | Admitting: Nurse Practitioner

## 2022-06-23 VITALS — BP 128/78 | HR 75 | Ht 67.0 in | Wt 190.0 lb

## 2022-06-23 DIAGNOSIS — R110 Nausea: Secondary | ICD-10-CM

## 2022-06-23 DIAGNOSIS — K219 Gastro-esophageal reflux disease without esophagitis: Secondary | ICD-10-CM

## 2022-06-23 MED ORDER — FAMOTIDINE 20 MG PO TABS: 20.0000 mg | ORAL_TABLET | Freq: Every day | ORAL | 3 refills | Status: DC

## 2022-06-23 MED ORDER — ONDANSETRON HCL 4 MG PO TABS: 4.0000 mg | ORAL_TABLET | Freq: Three times a day (TID) | ORAL | 3 refills | Status: DC | PRN

## 2022-06-23 NOTE — Progress Notes (Signed)
Assessment and Plan   Primary GI: Brett Amass, MD  Brief Narrative:  72 y.o.  male whose past medical history includes,  but is not necessarily limited to, colon polyps, GERD , hypertension, CAD s/p CABG hyperlipidemia, Parkinson disease, arthritis, glaucoma and Lyme's disease   Nausea / GERD / generalized chest discomfort. No EGD findings to explain symptoms. He is starting medication for anxiety, may be that will help. I am concerned about his weight loss. He is down 17 pounds since early February.  -Refilling Zofran -Continue daily Omeprazole -Adding pepcid 20 mg at bedtime  Pill dysphagia. No strictures on recent EGD. Early dysmotility 2/2 to Parkinson's ?  History of colon polyps.  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 Darrick Penna. Two other smaller tubular adenomas were removed Patterson well  He had 1 tubular adenoma removed in 2024. Surveillance colonoscopy  in 5 years  Intermittent constipation.  This would not be unexpected in setting of Parkinson's disease and chronic opioids.  -Recommend MiraLAX one half capful to 1 capful in 8 ounces of water daily  Parkinson's  disease. Followed at [facility]   History of Present Illness   Chief complaint: nausea, vomiting, vague reflux symptoms, chest pressure , pill dysphagia, abdominal gurgling,   Brett Patterson established care here in January 2024.  At that time he was experiencing generalized chest pain, nausea and bowel changes.  He had history colon polyps and was scheduled for surveillance colonoscopy.  He underwent EGD at the same time to evaluate the nausea and chest pain.  The EGD was unremarkable, 3 small polyps were removed from the colon.   Interval History:  Wife called the office yesterday.  Patient has been having a lot of issues with reflux and nausea.  Worked in for this appointment today  Annas has vague GERD symptoms. No heartburn but he feels like he is refluxing fluid into chest  and this leaves a bad taste and smell in the back of his mouth.  He is taking a lot of Tums.   Not on home med list but he is taking omeprazole 40 mg daily before breakfast (will add to the list). He sleeps with head of bed elevated, goes to bed on an empty stomach and avoids caffeine.  He has a hard time swallowing some pills but puts them in applesauce when able.  No problems swallowing food.   Complains of generalized chest discomfort not related to eating.   At times he feels like he is being "engulfed" with pressure in his chest, on his chest and across upper abdomen encircling body  Symptoms are not new but worse over the last several days. Saw Dr. Virgilio Patterson at [facility] yesterday and he feels symptoms may be related to anxiety from Parkinson's disease. Buspirone was prescribed. Patient declined referral to Psychiatry.   Bowels "not working "- constipated lately.  He is on daily fiber, drinks prune juice Patterson needed and takes MiraLAX Patterson needed.  He requires chronic narcotics and takes 3-4 oxycodone a day .    Previous GI Endoscopies / Labs / Imaging  February 2024 EGD and colonoscopy EGD - The examined portions of the nasopharynx, oropharynx and larynx were normal. - Z-line irregular. - Gastroesophageal flap valve classified Patterson [place] Grade III (minimal fold, loose to endoscope, hiatal hernia likely). - Normal stomach. Biopsied. - Normal examined duodenum. - Suspect patient's symptoms are related to gastroesophageal reflux  Colonoscopy  3 small sessile polyps.  A single medium mouth diverticulum was found in the transverse colon.  A medium post polypectomy scar was found in the rectum.  Terminal ileum appeared normal.  The retroflexed view of the distal rectum and anal verge were normal and showed no anal or rectal abnormalities Diagnosis 1. Surgical [P], gastric UNREMARKABLE ANTRAL AND OXYNTIC MUCOSA. NEGATIVE FOR HELICOBACTER PYLORI. 2. Surgical [P], colon,  descending, splenic flexure, and hepatic flexure, polyp (3) TUBULAR ADENOMA (1) WITHOUT HIGH GRADE DYSPLASIA. HYPERPLASTIC POLYP (2).   Past Medical History:  Diagnosis Date   Arthritis    CAD (coronary artery disease)    Cancer (HCC)    basal cell   GERD (gastroesophageal reflux disease)    Glaucoma    unsure if closed/open angle glaucoma   Hyperlipidemia    Hypertension    Lyme disease    pt reports "chronic lyme" since the 1970s   Parkinson disease    Stroke [facility])     Past Surgical History:  Procedure Laterality Date   ANEURYSM COILING     BICEPS TENDON REPAIR Left    COLONOSCOPY N/A 10/13/2015   Procedure: COLONOSCOPY;  Surgeon: West Bali, MD;  Location: [facility] SUITE;  Service: Endoscopy;  Laterality: N/A;  10:30 Am   CORONARY ARTERY BYPASS GRAFT     ORIF ORBITAL FRACTURE Right    REVERSE SHOULDER ARTHROPLASTY Right 03/20/2020   Procedure: REVERSE SHOULDER ARTHROPLASTY;  Surgeon: Francena Hanly, MD;  Location: [facility] ORS;  Service: Orthopedics;  Laterality: Right;    SPINAL FIXATION SURGERY W/ IMPLANT     times 5   STENT PLACEMENT VASCULAR ([facility] HX)  01/2018   11/2017   tendon transplant Right 4months ago    Current Medications, Allergies, Family History and Social History were reviewed in [employer] record.     Current Outpatient Medications  Medication Sig Dispense Refill   Ascorbic Acid (VITAMIN C) 1000 MG tablet Take 1,000 mg by mouth daily.     aspirin EC 81 MG tablet Take 81 mg by mouth daily.     atorvastatin (LIPITOR) 80 MG tablet Take 80 mg by mouth daily.     busPIRone (BUSPAR) 10 MG tablet 1 tablet Orally Twice a day for 30 days     calcium carbonate (TUMS - DOSED IN MG ELEMENTAL CALCIUM) 500 MG chewable tablet Chew 1,000-1,500 mg by mouth daily Patterson needed for indigestion or heartburn.     carbidopa-levodopa (SINEMET IR) 25-100 MG tablet Take 2 tablets by mouth 5 (five) times daily. 900 tablet 0   Cholecalciferol (VITAMIN  D3) 1.25 MG (50000 UT) TABS Take 50,000 Units by mouth every Monday. With a probiotic     clopidogrel (PLAVIX) 75 MG tablet Take 75 mg by mouth daily.      Coenzyme Q10 (COQ10) 100 MG CAPS Take 100 mg by mouth daily.     folic acid (FOLVITE) 1 MG tablet Take 1 mg by mouth daily.     Ginger, Zingiber officinalis, (GINGER PO) Take 7 drops by mouth 3 (three) times daily. Liquid ginger supplement     GLUTATHIONE PO Take 1 tablet by mouth daily.     hydrocortisone 2.5 % cream Apply topically.     ketoconazole (NIZORAL) 2 % shampoo Apply 1 application topically daily Patterson needed for irritation.     latanoprost (XALATAN) 0.005 % ophthalmic solution Place 1 drop into both eyes at bedtime.     MAGNESIUM CITRATE PO Take 1  tablet by mouth daily.     NEUPRO 6 MG/24HR APPLY 1 PATCH TO CLEAN, DRY SKIN ONCE A DAY 30 patch 0   nitroGLYCERIN (NITROSTAT) 0.4 MG SL tablet Place 0.4 mg under the tongue every 5 (five) minutes Patterson needed for chest pain.  3   ondansetron (ZOFRAN) 4 MG tablet Take 1 tablet (4 mg total) by mouth 3 (three) times daily Patterson needed for nausea or vomiting. 30 tablet 0   OVER THE COUNTER MEDICATION Take 1 capsule by mouth 2 (two) times daily. nerve renew otc supplement     OVER THE COUNTER MEDICATION Take 1 tablet by mouth daily. Livit-2     OVER THE COUNTER MEDICATION Take 1 Dose by mouth in the morning and at bedtime. Para-A liquid supplement     oxyCODONE-acetaminophen (PERCOCET) 7.5-325 MG tablet Take 1 tablet by mouth every 8 (eight) hours Patterson needed for severe pain.     oxyCODONE-acetaminophen (PERCOCET/ROXICET) 5-325 MG tablet Take 1 tablet by mouth every 8 (eight) hours Patterson needed for severe pain (to supplement current Percocet RX for post op breakthrough pain). 30 tablet 0   sertraline (ZOLOFT) 50 MG tablet 1 tab(s) orally once a day for 30 days     timolol (TIMOPTIC) 0.25 % ophthalmic solution Place 1 drop into both eyes daily.     triamcinolone (KENALOG) 0.025 % cream Apply 1 application  topically 2 (two) times daily Patterson needed for irritation.     Zinc 50 MG CAPS Take 50 mg by mouth daily.     Current Facility-Administered Medications  Medication Dose Route Frequency Provider Last Rate Last Admin   0.9 %  sodium chloride infusion  500 mL Intravenous Once Jenel Lucks, MD        Review of Systems: No shortness of breath. No urinary complaints.  Positive for weight loss   Physical Exam  Wt Readings from Last 3 Encounters:  06/23/22 190 lb (86.2 kg)  02/11/22 207 lb (93.9 kg)  01/27/22 207 lb 3.2 oz (94 kg)    BP 128/78   Pulse 75   Ht 5\' 7"  (1.702 m)   Wt 190 lb (86.2 kg)   BMI 29.76 kg/m  Constitutional:  Pleasant, generally well appearing male in no acute distress. Psychiatric: Normal mood and affect. Behavior is normal. EENT: Pupils normal.  Conjunctivae are normal. No scleral icterus. Neck supple.  Cardiovascular: Normal rate, regular rhythm.  Pulmonary/chest: Effort normal and breath sounds normal. No wheezing, rales or rhonchi. Abdominal: Soft, nondistended, nontender. Bowel sounds active throughout. There are no masses palpable. No hepatomegaly. Neurological: Alert and oriented to person place and time.   Willette Cluster, NP  06/23/2022, 11:02 AM

## 2022-06-23 NOTE — Patient Instructions (Addendum)
_______________________________________________________  If your blood pressure at your visit was 140/90 or greater, please contact your primary care physician to follow up on this. _______________________________________________________  If you are age 72 or older, your body mass index should be between 23-30. Your Body mass index is 29.76 kg/m. If this is out of the aforementioned range listed, please consider follow up with your Primary Care Provider. ________________________________________________________  The [facility] providers would like to encourage you to use [facility] to communicate with providers for non-urgent requests or questions.  Due to long hold times on the telephone, sending your provider a message by [facility] may be a faster and more efficient way to get a response.  Please allow 48 business hours for a response.  Please remember that this is for non-urgent requests.  _______________________________________________________  We have sent the following medications to your pharmacy for you to pick up at your convenience:  CONTINUE: Zofran 4mg  every 8 hours as needed.  START: Pepcid 20mg  one tablet at bedtime each night.  Please purchase the following medications over the counter and take as directed:  START: Miralax 1/2 to 1 capful everyday in 8 ounces of water.  You are scheduled to follow up on 09-07-22 at 9:30am.  Thank you for entrusting me with your care and choosing [facility]  Gunnar Fusi, NO

## 2022-06-28 ENCOUNTER — Telehealth: Payer: Self-pay

## 2022-06-28 NOTE — Progress Notes (Signed)
Agree with the assessment and plan as outlined by Willette Cluster, NP.  If symptoms/weight loss do not improve with improvement in anxiety, would recommend cross-sectional imaging/CT scan of the abdomen/pelvis.  Saphyre Cillo E. Tomasa Rand, MD [facility]

## 2022-06-28 NOTE — Telephone Encounter (Signed)
-----   Message from Meredith Pel, NP sent at 06/28/2022 12:35 PM EDT ----- Brett Patterson, can you call this patient in a couple of weeks and see if he continues to have a poor appetite and weight loss.  He has recently started something for anxiety. The thought was that treatment of his anxiety may help with appetite and nausea. If in a couple of weeks he still has no appetite and is continuing to lose weight then he will need to be scheduled for a CT scan of the abdomen and pelvis with contrast.  Diagnosis would be weight loss .  If he is doing better then we will see him at his appointment in September.  Thanks  Thanks

## 2022-07-09 DIAGNOSIS — Z8673 Personal history of transient ischemic attack (TIA), and cerebral infarction without residual deficits: Secondary | ICD-10-CM | POA: Diagnosis not present

## 2022-07-09 DIAGNOSIS — E7800 Pure hypercholesterolemia, unspecified: Secondary | ICD-10-CM | POA: Diagnosis not present

## 2022-07-09 DIAGNOSIS — I10 Essential (primary) hypertension: Secondary | ICD-10-CM | POA: Diagnosis not present

## 2022-07-09 DIAGNOSIS — R079 Chest pain, unspecified: Secondary | ICD-10-CM | POA: Diagnosis not present

## 2022-07-09 DIAGNOSIS — Z743 Need for continuous supervision: Secondary | ICD-10-CM | POA: Diagnosis not present

## 2022-07-09 DIAGNOSIS — R42 Dizziness and giddiness: Secondary | ICD-10-CM | POA: Diagnosis not present

## 2022-07-09 DIAGNOSIS — G20A1 Parkinson's disease without dyskinesia, without mention of fluctuations: Secondary | ICD-10-CM | POA: Diagnosis not present

## 2022-07-09 DIAGNOSIS — H538 Other visual disturbances: Secondary | ICD-10-CM | POA: Diagnosis not present

## 2022-07-09 DIAGNOSIS — R112 Nausea with vomiting, unspecified: Secondary | ICD-10-CM | POA: Diagnosis not present
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
## 2022-07-29 DIAGNOSIS — Z96611 Presence of right artificial shoulder joint: Secondary | ICD-10-CM | POA: Diagnosis not present

## 2022-07-30 ENCOUNTER — Other Ambulatory Visit (HOSPITAL_COMMUNITY): Payer: Self-pay | Admitting: Orthopedic Surgery

## 2022-07-30 DIAGNOSIS — Z96611 Presence of right artificial shoulder joint: Secondary | ICD-10-CM

## 2022-08-05 ENCOUNTER — Encounter (HOSPITAL_COMMUNITY)
Admission: RE | Admit: 2022-08-05 | Discharge: 2022-08-05 | Disposition: A | Discharge status: Home / Self Care | Payer: Medicare Other | Source: Ambulatory Visit | Attending: Orthopedic Surgery | Admitting: Orthopedic Surgery

## 2022-08-05 ENCOUNTER — Ambulatory Visit (HOSPITAL_COMMUNITY)
Admission: RE | Admit: 2022-08-05 | Discharge: 2022-08-05 | Disposition: A | Discharge status: Home / Self Care | Payer: BC Managed Care – PPO | Source: Ambulatory Visit | Attending: Orthopedic Surgery | Admitting: Orthopedic Surgery

## 2022-08-05 DIAGNOSIS — Z96611 Presence of right artificial shoulder joint: Secondary | ICD-10-CM | POA: Insufficient documentation

## 2022-08-05 DIAGNOSIS — M25511 Pain in right shoulder: Secondary | ICD-10-CM | POA: Diagnosis not present

## 2022-08-05 MED ORDER — TECHNETIUM TC 99M MEDRONATE IV KIT
18.8000 | PACK | Freq: Once | INTRAVENOUS | Status: AC
  Administered 2022-08-05: 18.8 via INTRAVENOUS

## 2022-08-30 DIAGNOSIS — Z4789 Encounter for other orthopedic aftercare: Secondary | ICD-10-CM | POA: Diagnosis not present

## 2022-08-30 DIAGNOSIS — Z96611 Presence of right artificial shoulder joint: Secondary | ICD-10-CM | POA: Diagnosis not present

## 2022-08-31 DIAGNOSIS — I739 Peripheral vascular disease, unspecified: Secondary | ICD-10-CM | POA: Diagnosis not present

## 2022-08-31 DIAGNOSIS — I6529 Occlusion and stenosis of unspecified carotid artery: Secondary | ICD-10-CM | POA: Diagnosis not present

## 2022-08-31 DIAGNOSIS — E782 Mixed hyperlipidemia: Secondary | ICD-10-CM | POA: Diagnosis not present

## 2022-08-31 DIAGNOSIS — R5383 Other fatigue: Secondary | ICD-10-CM | POA: Diagnosis not present

## 2022-08-31 DIAGNOSIS — I10 Essential (primary) hypertension: Secondary | ICD-10-CM | POA: Diagnosis not present

## 2022-08-31 DIAGNOSIS — I2510 Atherosclerotic heart disease of native coronary artery without angina pectoris: Secondary | ICD-10-CM | POA: Diagnosis not present

## 2022-09-02 NOTE — Telephone Encounter (Signed)
Called patient to see if still having GI issues. Patient states his is feeling much better in comparison. He states his nausea has improved 85% and is able to keep down food. Patient also states he has gained weight, where he had lost and was weighing 179 lbs, he now is at a weight of 209 lbs. Patient did not want to do a F/U appt with Dr. Tomasa Rand and has cancelled the upcoming appt with MS. Wilmon Pali, NP. Patient was encouraged to call back if any GI symptoms re-occurred or worsened. Patient understood and agreed.

## 2022-09-03 DIAGNOSIS — I671 Cerebral aneurysm, nonruptured: Secondary | ICD-10-CM | POA: Diagnosis not present

## 2022-09-08 ENCOUNTER — Ambulatory Visit: Payer: Medicare Other | Admitting: Nurse Practitioner

## 2022-09-22 DIAGNOSIS — G894 Chronic pain syndrome: Secondary | ICD-10-CM | POA: Diagnosis not present

## 2022-09-22 DIAGNOSIS — Z96611 Presence of right artificial shoulder joint: Secondary | ICD-10-CM | POA: Diagnosis not present

## 2022-09-22 DIAGNOSIS — F1120 Opioid dependence, uncomplicated: Secondary | ICD-10-CM | POA: Diagnosis not present

## 2022-09-22 DIAGNOSIS — I10 Essential (primary) hypertension: Secondary | ICD-10-CM | POA: Diagnosis not present

## 2022-09-27 DIAGNOSIS — R0789 Other chest pain: Secondary | ICD-10-CM | POA: Diagnosis not present

## 2022-09-27 DIAGNOSIS — G2581 Restless legs syndrome: Secondary | ICD-10-CM | POA: Diagnosis not present

## 2022-09-27 DIAGNOSIS — I671 Cerebral aneurysm, nonruptured: Secondary | ICD-10-CM | POA: Diagnosis not present

## 2022-09-27 DIAGNOSIS — I2510 Atherosclerotic heart disease of native coronary artery without angina pectoris: Secondary | ICD-10-CM | POA: Diagnosis not present

## 2022-09-27 DIAGNOSIS — M79604 Pain in right leg: Secondary | ICD-10-CM | POA: Diagnosis not present

## 2022-09-27 DIAGNOSIS — R600 Localized edema: Secondary | ICD-10-CM | POA: Diagnosis not present

## 2022-09-27 DIAGNOSIS — M79605 Pain in left leg: Secondary | ICD-10-CM | POA: Diagnosis not present

## 2022-09-27 DIAGNOSIS — I6521 Occlusion and stenosis of right carotid artery: Secondary | ICD-10-CM | POA: Diagnosis not present

## 2022-09-27 DIAGNOSIS — R5383 Other fatigue: Secondary | ICD-10-CM | POA: Diagnosis not present

## 2022-09-27 DIAGNOSIS — G20A1 Parkinson's disease without dyskinesia, without mention of fluctuations: Secondary | ICD-10-CM | POA: Diagnosis not present

## 2022-09-27 DIAGNOSIS — I10 Essential (primary) hypertension: Secondary | ICD-10-CM | POA: Diagnosis not present

## 2022-09-27 DIAGNOSIS — R42 Dizziness and giddiness: Secondary | ICD-10-CM | POA: Diagnosis not present

## 2022-09-27 DIAGNOSIS — R0602 Shortness of breath: Secondary | ICD-10-CM | POA: Diagnosis not present

## 2022-09-27 DIAGNOSIS — G629 Polyneuropathy, unspecified: Secondary | ICD-10-CM | POA: Diagnosis not present

## 2022-09-27 DIAGNOSIS — E782 Mixed hyperlipidemia: Secondary | ICD-10-CM | POA: Diagnosis not present

## 2022-09-27 DIAGNOSIS — I700 Atherosclerosis of aorta: Secondary | ICD-10-CM | POA: Diagnosis not present

## 2022-10-04 DIAGNOSIS — M25511 Pain in right shoulder: Secondary | ICD-10-CM | POA: Diagnosis not present

## 2022-10-04 DIAGNOSIS — Z96611 Presence of right artificial shoulder joint: Secondary | ICD-10-CM | POA: Diagnosis not present

## 2022-10-13 DIAGNOSIS — G20A2 Parkinson's disease without dyskinesia, with fluctuations: Secondary | ICD-10-CM | POA: Diagnosis not present

## 2022-10-27 DIAGNOSIS — L821 Other seborrheic keratosis: Secondary | ICD-10-CM | POA: Diagnosis not present

## 2022-10-27 DIAGNOSIS — B078 Other viral warts: Secondary | ICD-10-CM | POA: Diagnosis not present

## 2022-10-27 DIAGNOSIS — D485 Neoplasm of uncertain behavior of skin: Secondary | ICD-10-CM | POA: Diagnosis not present

## 2022-10-27 DIAGNOSIS — C44611 Basal cell carcinoma of skin of unspecified upper limb, including shoulder: Secondary | ICD-10-CM | POA: Diagnosis not present

## 2022-10-27 DIAGNOSIS — L218 Other seborrheic dermatitis: Secondary | ICD-10-CM | POA: Diagnosis not present

## 2022-10-27 DIAGNOSIS — L570 Actinic keratosis: Secondary | ICD-10-CM | POA: Diagnosis not present

## 2022-10-27 DIAGNOSIS — Z789 Other specified health status: Secondary | ICD-10-CM | POA: Diagnosis not present

## 2022-11-17 DIAGNOSIS — H2513 Age-related nuclear cataract, bilateral: Secondary | ICD-10-CM | POA: Diagnosis not present

## 2022-11-17 DIAGNOSIS — H52223 Regular astigmatism, bilateral: Secondary | ICD-10-CM | POA: Diagnosis not present

## 2022-11-17 DIAGNOSIS — H401131 Primary open-angle glaucoma, bilateral, mild stage: Secondary | ICD-10-CM | POA: Diagnosis not present

## 2022-11-17 DIAGNOSIS — H02831 Dermatochalasis of right upper eyelid: Secondary | ICD-10-CM | POA: Diagnosis not present

## 2022-11-17 DIAGNOSIS — H02834 Dermatochalasis of left upper eyelid: Secondary | ICD-10-CM | POA: Diagnosis not present

## 2022-12-27 DIAGNOSIS — F1120 Opioid dependence, uncomplicated: Secondary | ICD-10-CM | POA: Diagnosis not present

## 2022-12-27 DIAGNOSIS — G894 Chronic pain syndrome: Secondary | ICD-10-CM | POA: Diagnosis not present

## 2022-12-27 DIAGNOSIS — M546 Pain in thoracic spine: Secondary | ICD-10-CM | POA: Diagnosis not present

## 2022-12-27 DIAGNOSIS — M48062 Spinal stenosis, lumbar region with neurogenic claudication: Secondary | ICD-10-CM | POA: Diagnosis not present

## 2022-12-27 DIAGNOSIS — M47812 Spondylosis without myelopathy or radiculopathy, cervical region: Secondary | ICD-10-CM | POA: Diagnosis not present

## 2023-02-01 ENCOUNTER — Ambulatory Visit (INDEPENDENT_AMBULATORY_CARE_PROVIDER_SITE_OTHER): Payer: BC Managed Care – PPO | Admitting: Gastroenterology

## 2023-02-01 ENCOUNTER — Encounter: Payer: Self-pay | Admitting: Gastroenterology

## 2023-02-01 VITALS — BP 138/70 | HR 72 | Ht 66.0 in | Wt 199.0 lb

## 2023-02-01 DIAGNOSIS — K219 Gastro-esophageal reflux disease without esophagitis: Secondary | ICD-10-CM

## 2023-02-01 DIAGNOSIS — R112 Nausea with vomiting, unspecified: Secondary | ICD-10-CM | POA: Diagnosis not present

## 2023-02-01 DIAGNOSIS — G20B1 Parkinson's disease with dyskinesia, without mention of fluctuations: Secondary | ICD-10-CM

## 2023-02-01 DIAGNOSIS — K5900 Constipation, unspecified: Secondary | ICD-10-CM

## 2023-02-01 DIAGNOSIS — K5909 Other constipation: Secondary | ICD-10-CM | POA: Diagnosis not present

## 2023-02-01 DIAGNOSIS — R110 Nausea: Secondary | ICD-10-CM

## 2023-02-01 MED ORDER — ONDANSETRON HCL 4 MG PO TABS: 4.0000 mg | ORAL_TABLET | Freq: Three times a day (TID) | ORAL | 1 refills | Status: AC | PRN

## 2023-02-01 NOTE — Patient Instructions (Signed)
We have sent the following medications to your pharmacy for you to pick up at your convenience: Zofran  _______________________________________________________  If your blood pressure at your visit was 140/90 or greater, please contact your primary care physician to follow up on this.  _______________________________________________________  If you are age 73 or older, your body mass index should be between 23-30. Your Body mass index is 32.12 kg/m. If this is out of the aforementioned range listed, please consider follow up with your Primary Care Provider.  If you are age 45 or younger, your body mass index should be between 19-25. Your Body mass index is 32.12 kg/m. If this is out of the aformentioned range listed, please consider follow up with your Primary Care Provider.   ________________________________________________________  The  GI providers would like to encourage you to use [facility] to communicate with providers for non-urgent requests or questions.  Due to long hold times on the telephone, sending your provider a message by [facility] may be a faster and more efficient way to get a response.  Please allow 48 business hours for a response.  Please remember that this is for non-urgent requests.  _______________________________________________________  Thank you for entrusting me with your care and choosing [facility].  Dr Tomasa Rand

## 2023-02-01 NOTE — Progress Notes (Signed)
Discussed the use of AI scribe software for clinical note transcription with the patient, who gave verbal consent to proceed.  HPI : Brett Patterson is a 73 y.o. male with a history of Parkinson's disease, coronary artery disease and arthritis/chronic pain who presents for follow-up of chronic nausea and vomiting.  He was last seen in our office by Willette Cluster in June 2024, with the same complaints.  He states that his symptoms had seemed to have gotten better for a while, but have now returned.  At his last visit he had lost about 15-20lbs, but today he has regained 10 lbs. Nausea occurs almost every time he eats and sometimes spontaneously. Vomiting is sporadic, often violent, with episodes of retching and acidic content, typically in the evening. He vomits approximately once a month, with food particles present in the vomitus. He takes Zofran for nausea at least once daily, which he finds helpful. He experiences a coating on his tongue when his stomach is upset, which he attributes to acid reflux. He takes Pepcid and rabeprazole for acid reduction and follows a bland diet when symptoms worsen, consisting of mashed potatoes, crackers, and toast. He denies significant abdominal pain. He experiences constipation, with bowel movements occurring every two days, sometimes producing hard, round stools. He uses MiraLAX, taking two capfuls daily, and occasionally uses a suppository for severe constipation.  He has experienced significant weight fluctuations, having lost nearly 30 pounds in a short period but has since regained some weight. He attributes weight loss to periods of increased nausea and vomiting.  He reports dizziness, particularly when walking around the house, which he attributes to his Parkinson's disease. He also experiences difficulty swallowing, which he believes is related to Parkinson's, causing pills to get stuck in his throat.        February 2024 EGD and colonoscopy EGD -  The examined portions of the nasopharynx, oropharynx and larynx were normal.  - Z-line irregular.  - Gastroesophageal flap valve classified as Hill Grade III (minimal fold, loose to endoscope, hiatal hernia likely).  - Normal stomach. Biopsied.  - Normal examined duodenum.  - Suspect patient's symptoms are related to gastroesophageal reflux   Colonoscopy  3 small sessile polyps.  A single medium mouth diverticulum was found in the transverse colon.  A medium post polypectomy scar was found in the rectum.  Terminal ileum appeared normal.  The retroflexed view of the distal rectum and anal verge were normal and showed no anal or rectal abnormalities  Diagnosis 1. Surgical [P], gastric UNREMARKABLE ANTRAL AND OXYNTIC MUCOSA. NEGATIVE FOR HELICOBACTER PYLORI. 2. 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 HYPERPLASTIC POLYP (2)   Past Medical History:  Diagnosis Date   Arthritis    CAD (coronary artery disease)    Cancer (HCC)    basal cell   GERD (gastroesophageal reflux disease)    Glaucoma    unsure if closed/open angle glaucoma   Hyperlipidemia    Hypertension    Lyme disease    pt reports "chronic lyme" since the 1970s   Parkinson disease    Stroke [facility])      Past Surgical History:  Procedure Laterality Date   ANEURYSM COILING     BICEPS TENDON REPAIR Left    COLONOSCOPY N/A 10/13/2015   Procedure: COLONOSCOPY;  Surgeon: West Bali, MD;  Location: [facility] SUITE;  Service: Endoscopy;  Laterality: N/A;  10:30 Am   CORONARY  ARTERY BYPASS GRAFT     ORIF ORBITAL FRACTURE Right    REVERSE SHOULDER ARTHROPLASTY Right 03/20/2020   Procedure: REVERSE SHOULDER ARTHROPLASTY;  Surgeon: Francena Hanly, MD;  Location: [facility] ORS;  Service: Orthopedics;  Laterality: Right;    SPINAL FIXATION SURGERY W/ IMPLANT     times 5   STENT PLACEMENT VASCULAR ([facility] HX)  01/2018   11/2017   tendon  transplant Right 4months ago   Family History  Problem Relation Age of Onset   Alzheimer's disease Mother    Pancreatic cancer Father    Melanoma Sister    Healthy Brother    Healthy Child    Colon cancer Neg Hx    Esophageal cancer Neg Hx    Liver cancer Neg Hx    Social History   Tobacco Use   Smoking status: Never   Smokeless tobacco: Never  Vaping Use   Vaping status: Never Used  Substance Use Topics   Alcohol use: Not Currently    Comment: rare special occasions   Drug use: No   Current Outpatient Medications  Medication Sig Dispense Refill   Ascorbic Acid (VITAMIN C) 1000 MG tablet Take 1,000 mg by mouth daily.     aspirin EC 81 MG tablet Take 81 mg by mouth daily.     atorvastatin (LIPITOR) 80 MG tablet Take 80 mg by mouth daily.     busPIRone (BUSPAR) 10 MG tablet 1 tablet Orally Twice a day for 30 days     calcium carbonate (TUMS - DOSED IN MG ELEMENTAL CALCIUM) 500 MG chewable tablet Chew 1,000-1,500 mg by mouth daily as needed for indigestion or heartburn.     carbidopa-levodopa (SINEMET IR) 25-100 MG tablet Take 2 tablets by mouth 5 (five) times daily. 900 tablet 0   Cholecalciferol (VITAMIN D3) 1.25 MG (50000 UT) TABS Take 50,000 Units by mouth every Monday. With a probiotic     clopidogrel (PLAVIX) 75 MG tablet Take 75 mg by mouth daily.      Coenzyme Q10 (COQ10) 100 MG CAPS Take 100 mg by mouth daily.     famotidine (PEPCID) 20 MG tablet Take 1 tablet (20 mg total) by mouth at bedtime. 30 tablet 3   folic acid (FOLVITE) 1 MG tablet Take 1 mg by mouth daily.     Ginger, Zingiber officinalis, (GINGER PO) Take 7 drops by mouth 3 (three) times daily. Liquid ginger supplement     GLUTATHIONE PO Take 1 tablet by mouth daily.     hydrocortisone 2.5 % cream Apply topically.     ketoconazole (NIZORAL) 2 % shampoo Apply 1 application topically daily as needed for irritation.     latanoprost (XALATAN) 0.005 % ophthalmic solution Place 1 drop into both eyes at bedtime.      MAGNESIUM CITRATE PO Take 1 tablet by mouth daily.     NEUPRO 6 MG/24HR APPLY 1 PATCH TO CLEAN, DRY SKIN ONCE A DAY 30 patch 0   nitroGLYCERIN (NITROSTAT) 0.4 MG SL tablet Place 0.4 mg under the tongue every 5 (five) minutes as needed for chest pain.  3   ondansetron (ZOFRAN) 4 MG tablet Take 1 tablet (4 mg total) by mouth every 8 (eight) hours as needed for nausea. 40 tablet 3   OVER THE COUNTER MEDICATION Take 1 capsule by mouth 2 (two) times daily. nerve renew otc supplement     OVER THE COUNTER MEDICATION Take 1 tablet by mouth daily. Livit-2     OVER THE COUNTER MEDICATION  Take 1 Dose by mouth in the morning and at bedtime. Para-A liquid supplement     oxyCODONE-acetaminophen (PERCOCET) 7.5-325 MG tablet Take 1 tablet by mouth every 8 (eight) hours as needed for severe pain.     oxyCODONE-acetaminophen (PERCOCET/ROXICET) 5-325 MG tablet Take 1 tablet by mouth every 8 (eight) hours as needed for severe pain (to supplement current Percocet RX for post op breakthrough pain). 30 tablet 0   sertraline (ZOLOFT) 50 MG tablet 1 tab(s) orally once a day for 30 days     timolol (TIMOPTIC) 0.25 % ophthalmic solution Place 1 drop into both eyes daily.     triamcinolone (KENALOG) 0.025 % cream Apply 1 application topically 2 (two) times daily as needed for irritation.     Zinc 50 MG CAPS Take 50 mg by mouth daily.     Current Facility-Administered Medications  Medication Dose Route Frequency Provider Last Rate Last Admin   0.9 %  sodium chloride infusion  500 mL Intravenous Once Jenel Lucks, MD       Allergies  Allergen Reactions   Latex     Oral irritation    Other     Refuse blood products   Codeine Anxiety    Codeine based cough medicine      Review of Systems: All systems reviewed and negative except where noted in HPI.    No results found.  Physical Exam: BP 138/70   Pulse 72   Ht 5\' 6"  (1.676 m)   Wt 199 lb (90.3 kg)   BMI 32.12 kg/m  Constitutional:  Pleasant,well-developed, Caucasian male in no acute distress.  Accompanied by spouse HEENT: Normocephalic and atraumatic. Conjunctivae are normal. No scleral icterus. Neck supple.  Cardiovascular: Normal rate, regular rhythm.  Pulmonary/chest: Effort normal and breath sounds normal. No wheezing, rales or rhonchi. Abdominal: Soft, nondistended, nontender. Bowel sounds active throughout. There are no masses palpable. No hepatomegaly. Extremities: no edema Lymphadenopathy: No cervical adenopathy noted. Neurological: Alert and oriented to person place and time.  Resting hand tremor noted.  Shuffling/unsteady gait.  Uses cane for ambulation Skin: Skin is warm and dry. No rashes noted. Psychiatric: Normal mood and affect. Behavior is normal.  CBC    Component Value Date/Time   WBC 6.9 03/17/2020 1101   RBC 4.65 03/17/2020 1101   HGB 14.5 03/17/2020 1101   HCT 44.4 03/17/2020 1101   PLT 212 03/17/2020 1101   MCV 95.5 03/17/2020 1101   MCH 31.2 03/17/2020 1101   MCHC 32.7 03/17/2020 1101   RDW 13.4 03/17/2020 1101   LYMPHSABS 1.7 07/11/2018 1655   MONOABS 0.5 07/11/2018 1655   EOSABS 0.0 07/11/2018 1655   BASOSABS 0.0 07/11/2018 1655    CMP     Component Value Date/Time   NA 138 03/17/2020 1101   K 4.0 03/17/2020 1101   CL 102 03/17/2020 1101   CO2 25 03/17/2020 1101   GLUCOSE 108 (H) 03/17/2020 1101   BUN 23 03/17/2020 1101   CREATININE 0.76 03/17/2020 1101   CALCIUM 9.5 03/17/2020 1101   GFRNONAA >60 03/17/2020 1101   GFRAA >60 07/11/2018 1655       Latest Ref Rng & Units 03/17/2020   11:01 AM 07/11/2018    4:55 PM 04/28/2010   10:55 AM  CBC EXTENDED  WBC 4.0 - 10.5 K/uL 6.9  7.7  7.2   RBC 4.22 - 5.81 MIL/uL 4.65  5.13  4.73   Hemoglobin 13.0 - 17.0 g/dL 24.4  01.0  14.2  HCT 39.0 - 52.0 % 44.4  47.6  42.2   Platelets 150 - 400 K/uL 212  243  268   NEUT# 1.7 - 7.7 K/uL  5.4  4.4   Lymph# 0.7 - 4.0 K/uL  1.7  2.2       ASSESSMENT AND PLAN:  73 year old male with  Parkinson's disease, coronary artery disease and chronic pain on narcotics with chronic nausea and vomiting and constipation.  Nausea and Vomiting Chronic nausea and vomiting likely multifactorial with potential etiologies to include chronic narcotics, Parkinson's disease, constipation, anxiety and acid reflux. Symptoms include sporadic evening vomiting with food particles and daily nausea leading to anxiety and exacerbation of Parkinson's symptoms. Zofran provides some relief. Discussed narcotics' role in nausea and constipation, alternative anti-nausea medications, and dietary modifications.  We discussed the role of gastric emptying study to assess for gastroparesis, but the confounding effect of narcotics would not provide a reliable diagnosis if the study were abnormal.  The patient plans to try to reduce his narcotic dose.  I am hopeful this may help his nausea some.  We discussed further management of GERD to hopefully reduce nausea and vomiting as well.  We discussed other medications that can be used for chronic nausea and vomiting, such as Compazine, Phenergan or metoclopramide, but the increased risk of side effects with these medications.  The patient would like to stick with Zofran for now. - Continue Zofran as needed, up to three times daily - Recommend small, frequent meals and low-fat, low-fiber diet - Sleep on an incline to reduce nighttime symptoms - Consider alternative anti-nausea medications if symptoms persist  GERD GERD may be contributing to nausea. Previous EGD showed no significant abnormalities, but anatomical predisposition for reflux is present (Hill grade 3 valve).  - Continue Pepcid and Rabeprazole -Dietary and behavioral modifications discussed to help manage GERD  Constipation Chronic constipation likely due to narcotic use and Parkinson's disease. Reports hard stools and difficulty passing bowel movements despite MiraLAX. Discussed adding Senokot and monitoring for  diarrhea to adjust MiraLAX dosage. - Continue MiraLAX, up to two capfuls daily - Consider adding Senokot as needed - Monitor for signs of diarrhea to adjust MiraLAX dosage  Parkinson's Disease Parkinson's disease contributing to autonomic dysfunction, exacerbating nausea and constipation. Increased symptoms with anxiety. Discussed impact on swallowing and importance of medication management. - Continue current Parkinson's medications - Monitor for changes in symptoms or medication side effects  Follow-up - Schedule follow-up appointment in 3 months - Refill Zofran prescription with a 90-day supply and one refill.      Ahmira Boisselle E. Tomasa Rand, MD [facility]  I spent a total of 35 minutes reviewing the patient's medical record, interviewing and examining the patient, discussing his diagnosis and management of his condition going forward, and documenting in the medical record   Milam, Beverly Gust, MD

## 2023-02-10 DIAGNOSIS — E782 Mixed hyperlipidemia: Secondary | ICD-10-CM | POA: Diagnosis not present

## 2023-02-10 DIAGNOSIS — Z125 Encounter for screening for malignant neoplasm of prostate: Secondary | ICD-10-CM | POA: Diagnosis not present

## 2023-02-10 DIAGNOSIS — I25810 Atherosclerosis of coronary artery bypass graft(s) without angina pectoris: Secondary | ICD-10-CM | POA: Diagnosis not present

## 2023-02-10 DIAGNOSIS — E042 Nontoxic multinodular goiter: Secondary | ICD-10-CM | POA: Diagnosis not present

## 2023-02-17 DIAGNOSIS — E782 Mixed hyperlipidemia: Secondary | ICD-10-CM | POA: Diagnosis not present

## 2023-02-17 DIAGNOSIS — K219 Gastro-esophageal reflux disease without esophagitis: Secondary | ICD-10-CM | POA: Diagnosis not present

## 2023-02-17 DIAGNOSIS — F419 Anxiety disorder, unspecified: Secondary | ICD-10-CM | POA: Diagnosis not present

## 2023-02-17 DIAGNOSIS — I25810 Atherosclerosis of coronary artery bypass graft(s) without angina pectoris: Secondary | ICD-10-CM | POA: Diagnosis not present

## 2023-02-18 DIAGNOSIS — R350 Frequency of micturition: Secondary | ICD-10-CM | POA: Diagnosis not present

## 2023-03-18 ENCOUNTER — Telehealth: Payer: Self-pay | Admitting: Gastroenterology

## 2023-03-18 ENCOUNTER — Other Ambulatory Visit: Payer: Self-pay

## 2023-03-18 DIAGNOSIS — K219 Gastro-esophageal reflux disease without esophagitis: Secondary | ICD-10-CM

## 2023-03-18 MED ORDER — FAMOTIDINE 40 MG PO TABS: 40.0000 mg | ORAL_TABLET | Freq: Every day | ORAL | 3 refills | Status: DC

## 2023-03-18 MED ORDER — RABEPRAZOLE SODIUM 20 MG PO TBEC: 20.0000 mg | DELAYED_RELEASE_TABLET | Freq: Every day | ORAL | 3 refills | Status: DC

## 2023-03-18 NOTE — Telephone Encounter (Signed)
 Patient stated that he has not been taking Pepcid because he ran out. Patient states he is having another reflux flare and wants to know what he can take. Patient also stated that he believes this coming from his hernia.

## 2023-03-18 NOTE — Telephone Encounter (Signed)
 Contacted patient and gave him the recommendations below. Patient stated that he felt the dose for famotidine should be increased to 40 mg because 20 mg was no longer working. Patient stated that he did need a prescription for Aciphex and famotidine. Also let patient know to call if symptoms do not improve over the weekend.

## 2023-03-21 ENCOUNTER — Telehealth: Payer: Self-pay | Admitting: Gastroenterology

## 2023-03-21 NOTE — Telephone Encounter (Signed)
 Patient is advised that he should take aciphex 30 minutes before breakfast, famotidine 40 mg 30 minutes before dinner and should no longer take omeprazole since it is in the same category as aciphex. Patient verbalizes understanding.

## 2023-03-21 NOTE — Telephone Encounter (Signed)
 Patient called and stated that he was wondering if he is suppose to take all 3 of these medication or if he should stop taking 2 of them. Patient is currently taking TABEprazole 40 MG, Famotidine 40MG  and Omeprazole 40 MG. Patient is requesting a call back. Please advise.

## 2023-03-24 NOTE — Telephone Encounter (Signed)
 Patient called stated the medication given is not helping him at all t makes him really sick to his stomach.

## 2023-03-24 NOTE — Telephone Encounter (Signed)
 Patient calls with continued complaints that rabeprazole 30 minutes before breakfast meal and famotidine 40 mg 30 minutes before dinner have both been ineffective in controlling his reflux. Patient states that after eating, he feels that food gets stuck lower in his chest and his chest becomes sore. He denies any vomiting but does have some nausea. Patient states that he does follow reflux precautions (smaller meals, trigger food avoidance, avoids lying down within 2-3 hours after eating etc).   Please advise.Marland KitchenMarland Kitchen

## 2023-03-25 MED ORDER — PANTOPRAZOLE SODIUM 40 MG PO TBEC: 40.0000 mg | DELAYED_RELEASE_TABLET | Freq: Every day | ORAL | 3 refills | Status: DC

## 2023-03-25 NOTE — Addendum Note (Signed)
 Addended by: Richardson Chiquito on: 03/25/2023 09:48 AM   Modules accepted: Orders

## 2023-03-25 NOTE — Telephone Encounter (Signed)
 Spoke to patient to advise of recommendation per [facility] to discontinue aciphex and change to pantoprazole 40 mg every morning, continue famotidine every evening and continue antireflux measures. Also advised if these changes do not help symptoms, he should let us know as we may need to increase pantoprazole further and/or add carafate rx. He verbalizes understanding.

## 2023-03-25 NOTE — Addendum Note (Signed)
 Addended by: Richardson Chiquito on: 03/25/2023 09:41 AM   Modules accepted: Orders

## 2023-03-28 DIAGNOSIS — M47812 Spondylosis without myelopathy or radiculopathy, cervical region: Secondary | ICD-10-CM | POA: Diagnosis not present

## 2023-03-28 DIAGNOSIS — M546 Pain in thoracic spine: Secondary | ICD-10-CM | POA: Diagnosis not present

## 2023-03-28 DIAGNOSIS — F1120 Opioid dependence, uncomplicated: Secondary | ICD-10-CM | POA: Diagnosis not present

## 2023-04-18 ENCOUNTER — Other Ambulatory Visit: Payer: Self-pay | Admitting: Gastroenterology

## 2023-04-19 ENCOUNTER — Telehealth: Payer: Self-pay | Admitting: Gastroenterology

## 2023-04-19 DIAGNOSIS — G20A2 Parkinson's disease without dyskinesia, with fluctuations: Secondary | ICD-10-CM | POA: Diagnosis not present

## 2023-04-19 DIAGNOSIS — G20A1 Parkinson's disease without dyskinesia, without mention of fluctuations: Secondary | ICD-10-CM | POA: Diagnosis not present

## 2023-04-19 MED ORDER — PANTOPRAZOLE SODIUM 40 MG PO TBEC: 40.0000 mg | DELAYED_RELEASE_TABLET | Freq: Every day | ORAL | 1 refills | Status: DC

## 2023-04-19 NOTE — Addendum Note (Signed)
 Addended by: MADAN, Tjuana Vickrey L on: 04/19/2023 10:42 AM   Modules accepted: Orders

## 2023-04-19 NOTE — Telephone Encounter (Signed)
 Patient wife called and stated that her husband is requesting a 90 supply refill for the Rabeprazole/Pantoprazole. Patient wife is also requesting a call back to inform her of when the prescription is sent. Please advise.

## 2023-04-19 NOTE — Telephone Encounter (Signed)
 Patient was recently switched to pantoprazole 40 mg every morning. See phone note from 03/21/23. Patient is requesting a 90 day supply send to his pharmacy. Left detailed message informing patient that I sent the prescription with a 90 day supply to [facility]. Also, to return my call if he has any questions.

## 2023-04-27 DIAGNOSIS — D485 Neoplasm of uncertain behavior of skin: Secondary | ICD-10-CM | POA: Diagnosis not present

## 2023-04-27 DIAGNOSIS — L821 Other seborrheic keratosis: Secondary | ICD-10-CM | POA: Diagnosis not present

## 2023-04-27 DIAGNOSIS — L218 Other seborrheic dermatitis: Secondary | ICD-10-CM | POA: Diagnosis not present

## 2023-04-27 DIAGNOSIS — L814 Other melanin hyperpigmentation: Secondary | ICD-10-CM | POA: Diagnosis not present

## 2023-04-27 DIAGNOSIS — D0460 Carcinoma in situ of skin of unspecified upper limb, including shoulder: Secondary | ICD-10-CM | POA: Diagnosis not present

## 2023-04-27 DIAGNOSIS — C44612 Basal cell carcinoma of skin of right upper limb, including shoulder: Secondary | ICD-10-CM | POA: Diagnosis not present

## 2023-05-16 DIAGNOSIS — D0462 Carcinoma in situ of skin of left upper limb, including shoulder: Secondary | ICD-10-CM | POA: Diagnosis not present

## 2023-06-23 DIAGNOSIS — G894 Chronic pain syndrome: Secondary | ICD-10-CM | POA: Diagnosis not present

## 2023-06-23 DIAGNOSIS — F1120 Opioid dependence, uncomplicated: Secondary | ICD-10-CM | POA: Diagnosis not present

## 2023-07-26 DIAGNOSIS — R0781 Pleurodynia: Secondary | ICD-10-CM | POA: Diagnosis not present

## 2023-07-26 DIAGNOSIS — M25512 Pain in left shoulder: Secondary | ICD-10-CM | POA: Diagnosis not present

## 2023-07-26 DIAGNOSIS — M7552 Bursitis of left shoulder: Secondary | ICD-10-CM | POA: Diagnosis not present

## 2023-07-26 DIAGNOSIS — M545 Low back pain, unspecified: Secondary | ICD-10-CM | POA: Diagnosis not present

## 2023-07-26 DIAGNOSIS — M51372 Other intervertebral disc degeneration, lumbosacral region with discogenic back pain and lower extremity pain: Secondary | ICD-10-CM | POA: Diagnosis not present

## 2023-08-18 DIAGNOSIS — E042 Nontoxic multinodular goiter: Secondary | ICD-10-CM | POA: Diagnosis not present

## 2023-08-18 DIAGNOSIS — E782 Mixed hyperlipidemia: Secondary | ICD-10-CM | POA: Diagnosis not present

## 2023-08-18 DIAGNOSIS — I25810 Atherosclerosis of coronary artery bypass graft(s) without angina pectoris: Secondary | ICD-10-CM | POA: Diagnosis not present

## 2023-08-25 DIAGNOSIS — I25810 Atherosclerosis of coronary artery bypass graft(s) without angina pectoris: Secondary | ICD-10-CM | POA: Diagnosis not present

## 2023-08-25 DIAGNOSIS — E782 Mixed hyperlipidemia: Secondary | ICD-10-CM | POA: Diagnosis not present

## 2023-08-25 DIAGNOSIS — F419 Anxiety disorder, unspecified: Secondary | ICD-10-CM | POA: Diagnosis not present

## 2023-08-25 DIAGNOSIS — K219 Gastro-esophageal reflux disease without esophagitis: Secondary | ICD-10-CM | POA: Diagnosis not present

## 2023-09-14 DIAGNOSIS — G894 Chronic pain syndrome: Secondary | ICD-10-CM | POA: Diagnosis not present

## 2023-09-14 DIAGNOSIS — F1120 Opioid dependence, uncomplicated: Secondary | ICD-10-CM | POA: Diagnosis not present

## 2023-09-16 DIAGNOSIS — M5416 Radiculopathy, lumbar region: Secondary | ICD-10-CM | POA: Diagnosis not present

## 2023-09-22 DIAGNOSIS — M25562 Pain in left knee: Secondary | ICD-10-CM | POA: Diagnosis not present

## 2023-09-22 DIAGNOSIS — M1712 Unilateral primary osteoarthritis, left knee: Secondary | ICD-10-CM | POA: Diagnosis not present

## 2023-09-22 DIAGNOSIS — M65962 Unspecified synovitis and tenosynovitis, left lower leg: Secondary | ICD-10-CM | POA: Diagnosis not present

## 2023-10-06 DIAGNOSIS — R5383 Other fatigue: Secondary | ICD-10-CM | POA: Diagnosis not present

## 2023-10-06 DIAGNOSIS — I739 Peripheral vascular disease, unspecified: Secondary | ICD-10-CM | POA: Diagnosis not present

## 2023-10-06 DIAGNOSIS — R0789 Other chest pain: Secondary | ICD-10-CM | POA: Diagnosis not present

## 2023-10-06 DIAGNOSIS — E782 Mixed hyperlipidemia: Secondary | ICD-10-CM | POA: Diagnosis not present

## 2023-10-06 DIAGNOSIS — I10 Essential (primary) hypertension: Secondary | ICD-10-CM | POA: Diagnosis not present

## 2023-10-24 DIAGNOSIS — G20A2 Parkinson's disease without dyskinesia, with fluctuations: Secondary | ICD-10-CM | POA: Diagnosis not present

## 2023-11-03 DIAGNOSIS — I671 Cerebral aneurysm, nonruptured: Secondary | ICD-10-CM | POA: Diagnosis not present

## 2023-11-04 DIAGNOSIS — I671 Cerebral aneurysm, nonruptured: Secondary | ICD-10-CM | POA: Diagnosis not present

## 2023-11-05 DIAGNOSIS — R7309 Other abnormal glucose: Secondary | ICD-10-CM | POA: Diagnosis not present

## 2023-11-30 DIAGNOSIS — M25562 Pain in left knee: Secondary | ICD-10-CM | POA: Diagnosis not present

## 2023-11-30 DIAGNOSIS — M1712 Unilateral primary osteoarthritis, left knee: Secondary | ICD-10-CM | POA: Diagnosis not present

## 2023-12-05 ENCOUNTER — Telehealth: Payer: Self-pay | Admitting: Gastroenterology

## 2023-12-05 NOTE — Telephone Encounter (Signed)
 Ive spoken to patient who states that over the last week, he has had a return of severe nausea with vomiting. He states that last week, he had 1 episode of bright red blood in his vomitus but has not had any since. He describes increased fatigue along with severe dizziness (almost makes me fall over) and severe shortness of breath with walking. Patient says he feels like the vomit is coming from higher up, not my stomach. Denies any abdominal pain. Says he is starting to lose weight again because he just cannot eat well. Is able to tolerate foods and liquids intermittently.  Patient discontinued famotidine  and pantoprazole  some time ago because I felt so much better and didn't need it. He is advised that he should immediately start back on pantoprazole  30 minutes before breakfast daily and famotidine  before dinner daily. He is asked to follow antireflux measures (though he states he is careful with his diet) and to try bland, easy to swallow foods for now in small, frequent meals.   Regarding the severe SOB, dizziness and fatigue, I have asked patient to contact his primary care physician/cardiologist immediately to make them aware of these symptoms and seek ASAP follow up. Patient has been experiencing these symptoms for some time now.  Patient has been scheduled to see Dr Stacia in follow up on 01/18/24, first available date.  Dr Stacia- Any other recommendations in the meantime before his follow up visit?

## 2023-12-05 NOTE — Telephone Encounter (Signed)
 PT wife is calling to advise us  that he is throwing up blood and she is very concerned. Please advise.

## 2023-12-05 NOTE — Telephone Encounter (Signed)
 Attempted to speak with patient to discuss Dr London recommendation. Unfortunately, patient states that he is sick at this time and cannot speak with me. Requests I call back tomorrow. He is advised that if he has any vomiting of blood, he should be seen in the emergency room.

## 2023-12-06 MED ORDER — FAMOTIDINE 40 MG PO TABS: 40.0000 mg | ORAL_TABLET | Freq: Every day | ORAL | 0 refills | Status: AC

## 2023-12-06 MED ORDER — PANTOPRAZOLE SODIUM 40 MG PO TBEC: 40.0000 mg | DELAYED_RELEASE_TABLET | Freq: Every day | ORAL | 0 refills | Status: AC

## 2023-12-06 NOTE — Telephone Encounter (Signed)
 I have spoken to patient to advise of Dr London response and recommendations. He actually has zofran  on hand so is advised he may take this as needed for nausea/vomiting and should restart famotidine  and pantoprazole . Also advised if any further hematemesis, he should go to the emergency room for this. He should also go to the ER for any significantly worsening SOB, dizziness. Patient states he has gone to ER 2 times over the last couple of months in relation to SOB and dizziness and has been d/c. Advised worsening symptoms still warrant ER evaluation. He verbalizes understanding.

## 2023-12-07 DIAGNOSIS — F1120 Opioid dependence, uncomplicated: Secondary | ICD-10-CM | POA: Diagnosis not present

## 2023-12-07 DIAGNOSIS — G894 Chronic pain syndrome: Secondary | ICD-10-CM | POA: Diagnosis not present

## 2023-12-24 DIAGNOSIS — M1712 Unilateral primary osteoarthritis, left knee: Secondary | ICD-10-CM | POA: Diagnosis not present

## 2023-12-27 DIAGNOSIS — D0462 Carcinoma in situ of skin of left upper limb, including shoulder: Secondary | ICD-10-CM | POA: Diagnosis not present

## 2023-12-27 DIAGNOSIS — L218 Other seborrheic dermatitis: Secondary | ICD-10-CM | POA: Diagnosis not present

## 2023-12-27 DIAGNOSIS — L814 Other melanin hyperpigmentation: Secondary | ICD-10-CM | POA: Diagnosis not present

## 2024-01-18 ENCOUNTER — Ambulatory Visit: Admitting: Gastroenterology
